# Patient Record
Sex: Female | Born: 2000 | Race: White | Hispanic: No | Marital: Single | Smoking: Current every day smoker
Health system: Southern US, Community
[De-identification: ages and names within clinical notes are randomized; demographics above are authoritative.]

## PROBLEM LIST (undated history)

## (undated) DIAGNOSIS — R519 Headache, unspecified: Secondary | ICD-10-CM

## (undated) DIAGNOSIS — Z789 Other specified health status: Secondary | ICD-10-CM

## (undated) HISTORY — PX: APPENDECTOMY: SHX54

---
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
## 2021-01-01 LAB — CBC/D/PLT+RPR+RH+ABO+RUBIGG...
Antibody Screen: NEGATIVE
Basophils Absolute: 0 10*3/uL (ref 0.0–0.2)
Basos: 0 %
EOS (ABSOLUTE): 0 10*3/uL (ref 0.0–0.4)
Eos: 0 %
HCV Ab: <0.1 s/co ratio (ref 0.0–0.9)
HIV Screen 4th Generation wRfx: NONREACTIVE
Hematocrit: 33.7 % — ABNORMAL LOW (ref 34.0–46.6)
Hemoglobin: 11 g/dL — ABNORMAL LOW (ref 11.1–15.9)
Hepatitis B Surface Ag: NEGATIVE
Immature Grans (Abs): 0.1 10*3/uL (ref 0.0–0.1)
Immature Granulocytes: 1 %
Lymphocytes Absolute: 2.5 10*3/uL (ref 0.7–3.1)
Lymphs: 24 %
MCH: 28.3 pg (ref 26.6–33.0)
MCHC: 32.6 g/dL (ref 31.5–35.7)
MCV: 87 fL (ref 79–97)
Monocytes Absolute: 0.6 10*3/uL (ref 0.1–0.9)
Monocytes: 6 %
Neutrophils Absolute: 7.1 10*3/uL — ABNORMAL HIGH (ref 1.4–7.0)
Neutrophils: 69 %
Platelets: 250 10*3/uL (ref 150–450)
RBC: 3.89 x10E6/uL (ref 3.77–5.28)
RDW: 14.2 % (ref 11.7–15.4)
RPR Ser Ql: NONREACTIVE
Rh Factor: POSITIVE
Rubella Antibodies, IGG: 8.33 index (ref >0.99)
WBC: 10.3 10*3/uL (ref 3.4–10.8)

## 2021-01-01 LAB — URINE CULTURE

## 2021-01-01 LAB — HGB FRACTIONATION CASCADE
Hgb A2: 2.7 % (ref 1.8–3.2)
Hgb A: 97.3 % (ref 96.4–98.8)
Hgb F: 0 % (ref 0.0–2.0)
Hgb S: 0 %

## 2021-01-01 LAB — HCV INTERPRETATION

## 2021-01-28 ENCOUNTER — Telehealth: Payer: Self-pay

## 2021-01-28 ENCOUNTER — Ambulatory Visit (INDEPENDENT_AMBULATORY_CARE_PROVIDER_SITE_OTHER): Payer: 59 | Admitting: Obstetrics & Gynecology

## 2021-01-28 ENCOUNTER — Encounter: Payer: Medicaid Other | Admitting: Women's Health

## 2021-01-28 ENCOUNTER — Encounter: Payer: Self-pay | Admitting: Obstetrics & Gynecology

## 2021-01-28 ENCOUNTER — Other Ambulatory Visit: Payer: Self-pay

## 2021-01-28 VITALS — BP 113/72 | HR 96

## 2021-01-28 DIAGNOSIS — R109 Unspecified abdominal pain: Secondary | ICD-10-CM

## 2021-01-28 DIAGNOSIS — R319 Hematuria, unspecified: Secondary | ICD-10-CM

## 2021-01-28 DIAGNOSIS — O09899 Supervision of other high risk pregnancies, unspecified trimester: Secondary | ICD-10-CM

## 2021-01-28 DIAGNOSIS — Z3480 Encounter for supervision of other normal pregnancy, unspecified trimester: Secondary | ICD-10-CM

## 2021-01-28 LAB — POCT URINALYSIS DIPSTICK OB
Glucose, UA: NEGATIVE
Ketones, UA: NEGATIVE
Nitrite, UA: NEGATIVE

## 2021-01-28 MED ORDER — TERCONAZOLE 0.4 % VA CREA: 1.0000 | TOPICAL_CREAM | Freq: Every day | VAGINAL | 0 refills | Status: DC

## 2021-01-28 NOTE — Telephone Encounter (Signed)
Pt called stating that 2 days ago she had some light bleeding, red in color, with strong cramping. It lasted for a couple of hours and stopped. No bleeding yesterday. Pt stated that the bleeding started again this morning at approx 7:30 am with strong cramping. Pt states that she sees the blood when she wipes. Pt denies any recent sex or straining with activity. Pt states that baby is moving less than she normally has been. Discussed with Joellyn Haff and said she needed to be seen. Pt appt moved to Dr Despina Hidden for an earlier time. Pt confirmed understanding.

## 2021-01-28 NOTE — Progress Notes (Signed)
LOW-RISK PREGNANCY VISIT Patient name: DNIYA NEUHAUS MRN [id number]  Date of birth: [birth year] Chief Complaint:   Routine Prenatal Visit (Light spotting with mild cramping, pressure with urination, chills occasionally)  History of Present Illness:   RASHA IBE is a 20 y.o. G65P1001 female at [redacted]w[redacted]d with an Estimated Date of Delivery: 05/24/21 being seen today for ongoing management of a low-risk pregnancy. Noticed some spotting when wiping, no frank bleeding and less movement  Sonogram reveals very active fetus, posterior placenta well awat from cervix and no evidence of MSA  SSE negative no blood has a yeast infection  Today she reports as above. Contractions: Not present. Vag. Bleeding: Scant.  Movement: (!) Decreased. denies leaking of fluid.  Depression screen [facility] 2/9 12/30/2020 08/23/2019 08/01/2019  Decreased Interest 0 0 0  Down, Depressed, Hopeless 0 0 0  PHQ - 2 Score 0 0 0  Altered sleeping 0 1 -  Tired, decreased energy 1 0 -  Change in appetite 0 1 -  Feeling bad or failure about yourself  0 0 -  Trouble concentrating 0 0 -  Moving slowly or fidgety/restless 0 0 -  Suicidal thoughts 0 0 -  PHQ-9 Score 1 2 -     GAD 7 : Generalized Anxiety Score 12/30/2020  Nervous, Anxious, on Edge 0  Control/stop worrying 0  Worry too much - different things 0  Trouble relaxing 0  Restless 0  Easily annoyed or irritable 1  Afraid - awful might happen 0  Total GAD 7 Score 1      Review of Systems:   Pertinent items are noted in HPI Denies abnormal vaginal discharge w/ itching/odor/irritation, headaches, visual changes, shortness of breath, chest pain, abdominal pain, severe nausea/vomiting, or problems with urination or bowel movements unless otherwise stated above. Pertinent History Reviewed:  Reviewed past medical,surgical, social, obstetrical and family history.  Reviewed problem list, medications and allergies. Physical Assessment:   Vitals:   01/28/21 1203  BP:  113/72  Pulse: 96  There is no height or weight on file to calculate BMI.        Physical Examination:   General appearance: Well appearing, and in no distress  Mental status: Alert, oriented to person, place, and time  Skin: Warm & dry  Cardiovascular: Normal heart rate noted  Respiratory: Normal respiratory effort, no distress  Abdomen: Soft, gravid, nontender  Pelvic: Cervical exam deferred         Extremities: Edema: None  Fetal Status:     Movement: (!) Decreased    Chaperone: N/A   Results for orders placed or performed in visit on 01/28/21 (from the past 24 hour(s))  POC Urinalysis Dipstick OB   Collection Time: 01/28/21 12:15 PM  Result Value Ref Range   Color, UA     Clarity, UA     Glucose, UA Negative Negative   Bilirubin, UA     Ketones, UA neg    Spec Grav, UA     Blood, UA large    pH, UA     POC,PROTEIN,UA Small (1+) Negative, Trace, Small (1+), Moderate (2+), Large (3+), 4+   Urobilinogen, UA     Nitrite, UA neg    Leukocytes, UA Moderate (2+) (A) Negative   Appearance     Odor      Assessment & Plan:  1) Low-risk pregnancy G2P1001 at [redacted]w[redacted]d with an Estimated Date of Delivery: 05/24/21   2) +yeast vaginitis, terazol   Meds:  Meds  ordered this encounter  Medications   terconazole (TERAZOL 7) 0.4 % vaginal cream    Sig: Place 1 applicator vaginally at bedtime.    Dispense:  45 g    Refill:  0   Labs/procedures today: bedside POC sonogram  Plan:  Continue routine obstetrical care  Next visit: prefers in person    Reviewed:  labor symptoms and general obstetric precautions including but not limited to vaginal bleeding, contractions, leaking of fluid and fetal movement were reviewed in detail with the patient.  All questions were answered.  have home bp cuff. Office bp cuff given: Marland Kitchen Check bp , let us know if consistently .  Follow-up: No follow-ups on file.  No future appointments.  Orders Placed This Encounter  Procedures   Urine Culture   POC  Urinalysis Dipstick OB   Amaryllis Dyke Lequisha Cammack  01/28/2021 12:46 PM

## 2021-01-30 LAB — URINE CULTURE

## 2021-02-26 ENCOUNTER — Other Ambulatory Visit: Payer: 59

## 2021-02-26 ENCOUNTER — Encounter: Payer: 59 | Admitting: Advanced Practice Midwife

## 2021-02-26 DIAGNOSIS — Z3A27 27 weeks gestation of pregnancy: Secondary | ICD-10-CM

## 2021-03-21 IMAGING — US US OB < 14 WEEKS - US OB TV
1 series · 14 of 28 positions shown · non-contrast
Comparison: None.

CLINICAL DATA: Pelvic pain for several hours, positive pregnancy
test

EXAM:
OBSTETRIC <14 WK US AND TRANSVAGINAL OB US
TECHNIQUE: Both transabdominal and transvaginal ultrasound examinations were
performed for complete evaluation of the gestation as well as the
maternal uterus, adnexal regions, and pelvic cul-de-sac.
Transvaginal technique was performed to assess early pregnancy.

[Series 1: us ob < 14 weeks - us ob tv · 0.17mm/px · 14 of 77 slices shown]
[im 3/77]
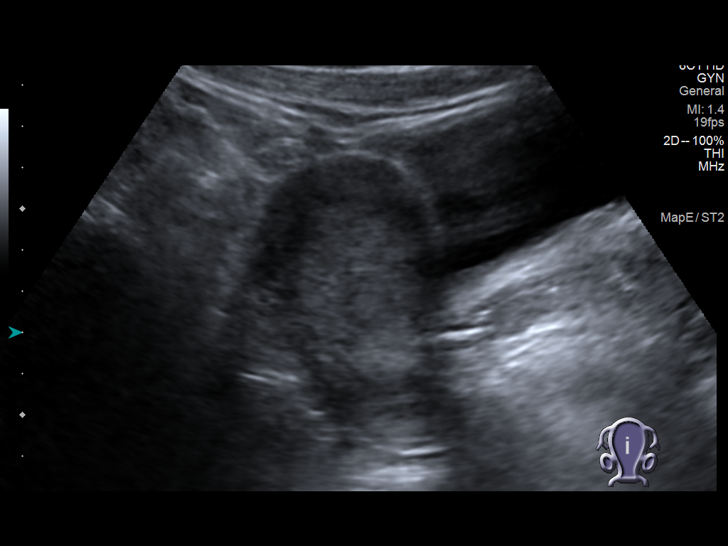
[im 9/77]
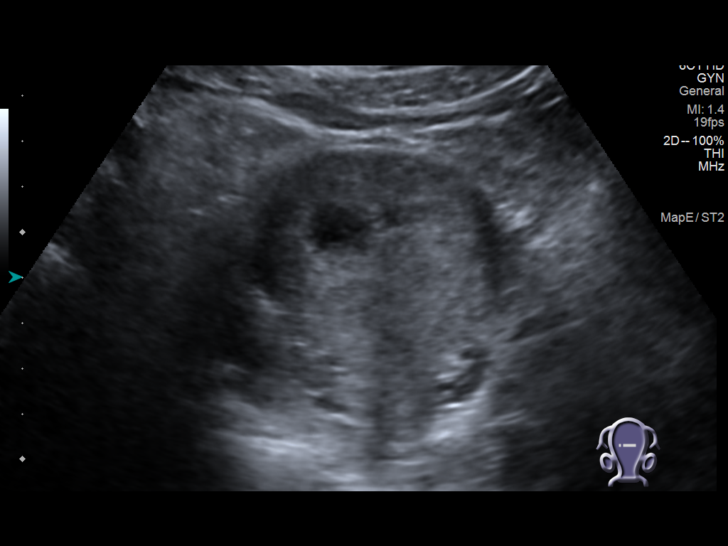
[im 15/77]
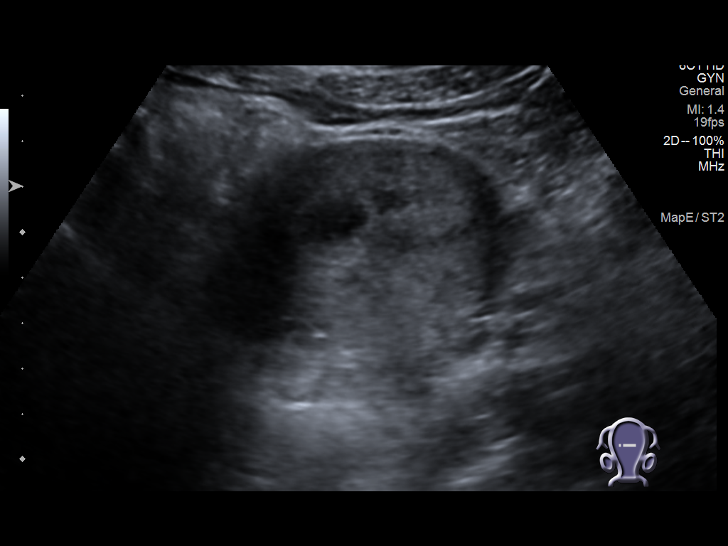
[im 20/77]
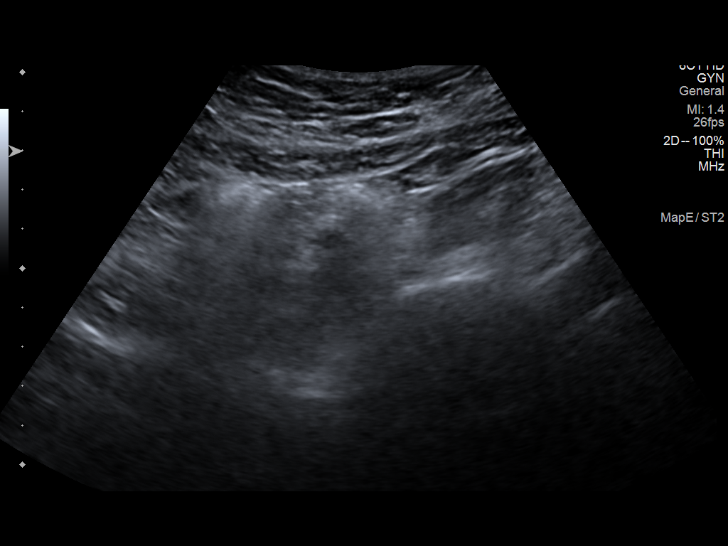
[im 26/77]
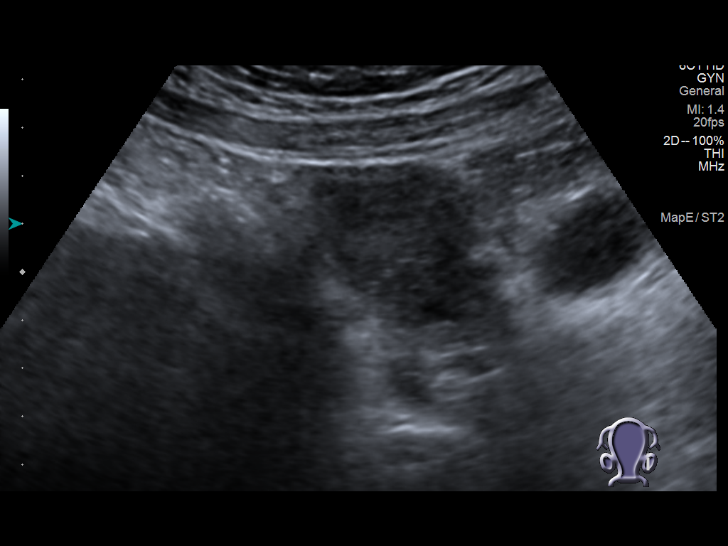
[im 31/77]
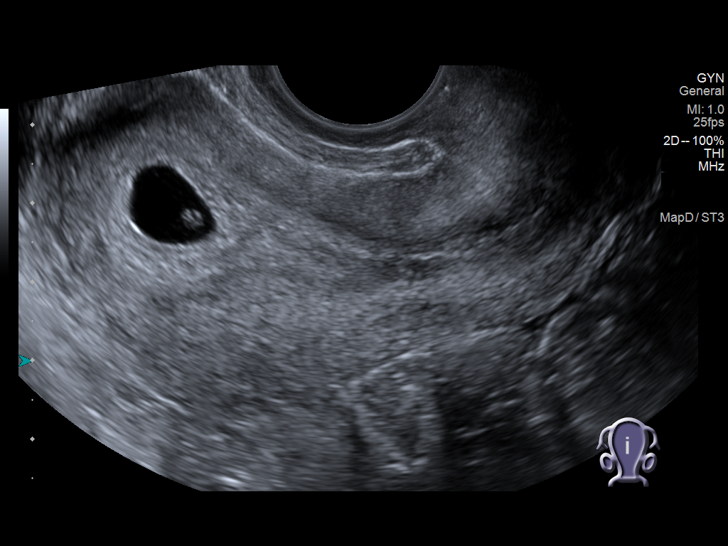
[im 37/77]
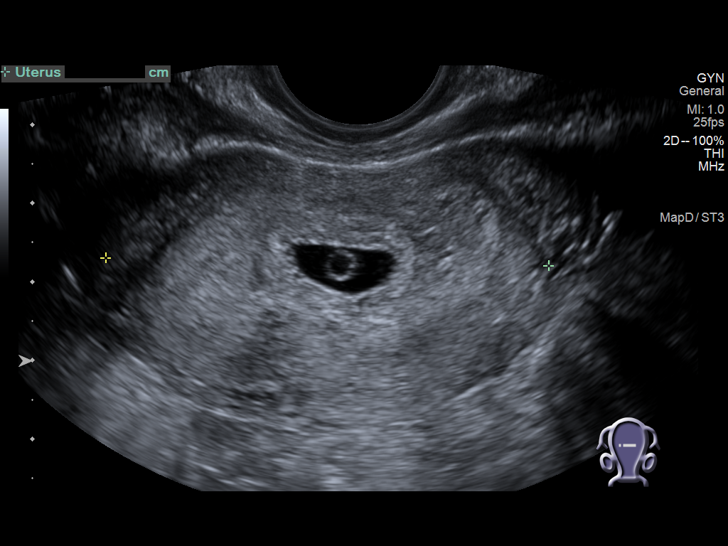
[im 43/77]
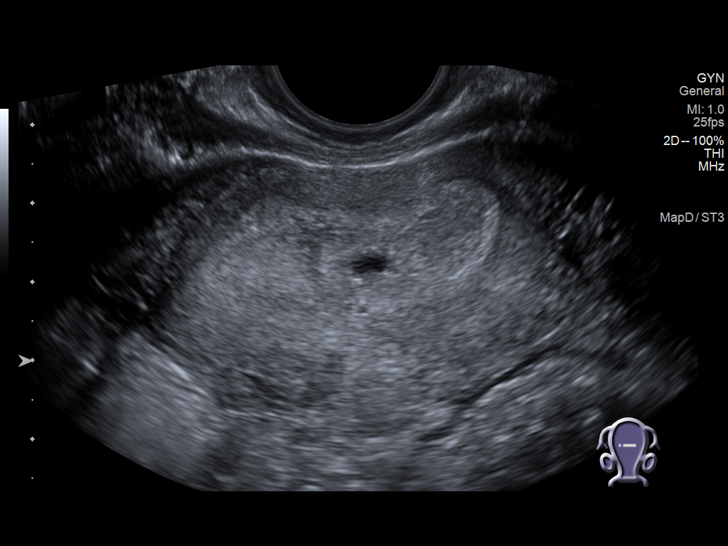
[im 48/77]
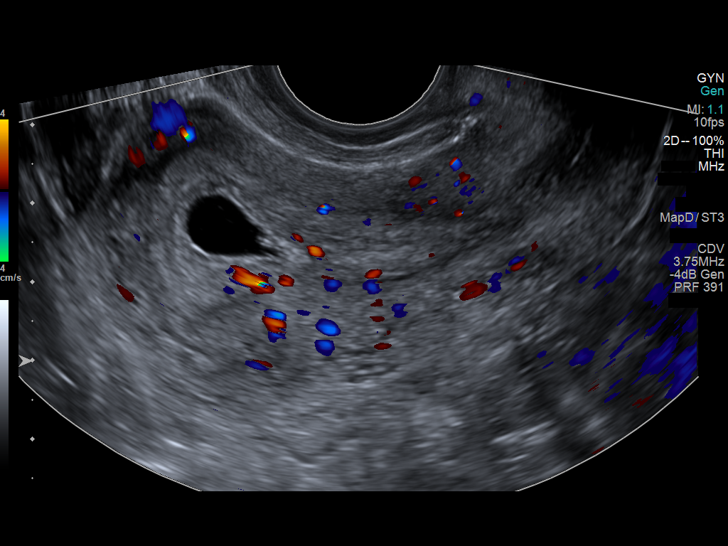
[im 54/77]
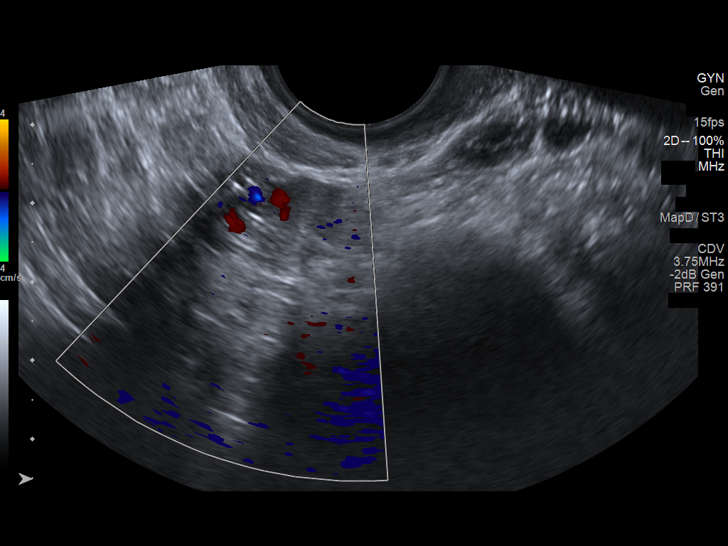
[im 60/77]
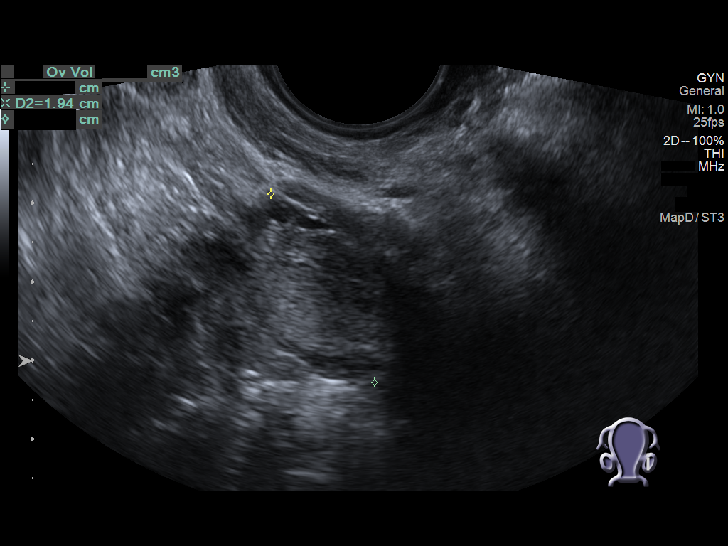
[im 65/77]
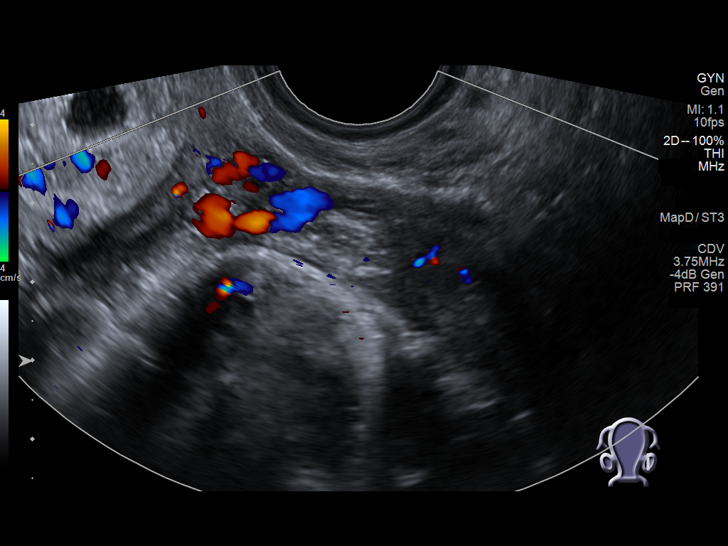
[im 71/77]
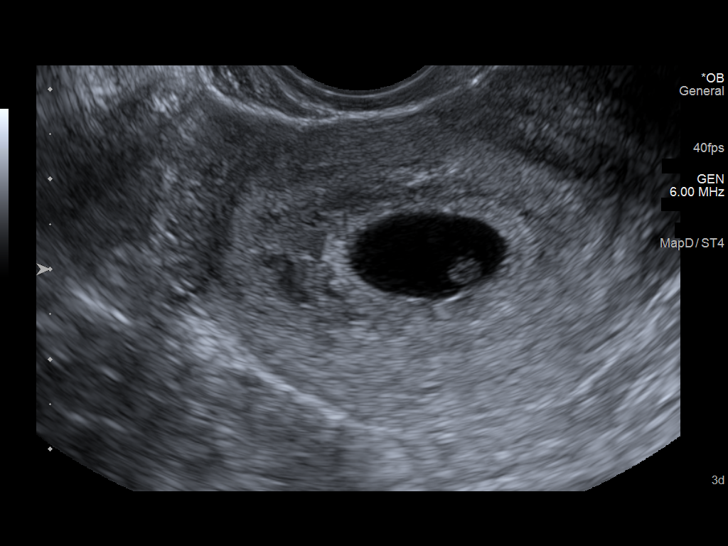
[im 77/77]
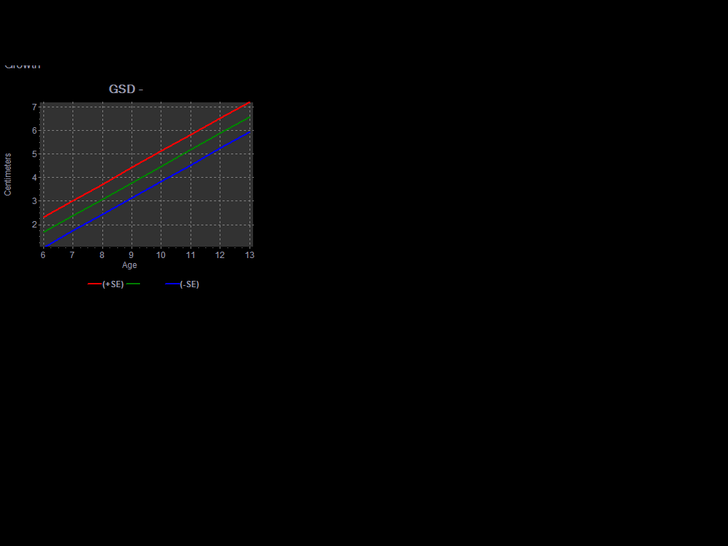

[14 of 28 positions shown; findings below may reference images not displayed]

FINDINGS: Intrauterine gestational sac: Present

Yolk sac:  Present

Embryo:  Absent

MSD: 18.6 mm   6 w   6 d

Subchorionic hemorrhage:  None visualized.

Maternal uterus/adnexae: Within normal limits.
IMPRESSION: Probable early intrauterine gestational sac and yolk sac but no
fetal pole, or cardiac activity yet visualized. Recommend follow-up
quantitative B-HCG levels and follow-up US in 14 days to assess
viability. This recommendation follows SRU consensus guidelines:
Diagnostic Criteria for Nonviable Pregnancy Early in the First
Trimester. N Engl J Med 2836; [DATE].

## 2021-04-09 ENCOUNTER — Encounter: Payer: 59 | Admitting: Women's Health

## 2021-04-09 ENCOUNTER — Other Ambulatory Visit: Payer: 59

## 2021-05-05 ENCOUNTER — Telehealth: Payer: Self-pay | Admitting: *Deleted

## 2021-05-05 DIAGNOSIS — O4290 Premature rupture of membranes, unspecified as to length of time between rupture and onset of labor, unspecified weeks of gestation: Secondary | ICD-10-CM | POA: Diagnosis not present

## 2021-05-05 DIAGNOSIS — Z3A37 37 weeks gestation of pregnancy: Secondary | ICD-10-CM | POA: Diagnosis not present

## 2021-05-05 NOTE — Telephone Encounter (Signed)
Patient's grandmother called stating [name] had sent her a message via facebook messenger that she was leaking fluid.  States it is a continuous leak and underwear are wet.  Advised she go to [facility] for SROM eval.  Verbalized understanding and agreeable to take her.

## 2021-05-13 ENCOUNTER — Ambulatory Visit (INDEPENDENT_AMBULATORY_CARE_PROVIDER_SITE_OTHER): Payer: 59 | Admitting: Obstetrics & Gynecology

## 2021-05-13 ENCOUNTER — Other Ambulatory Visit: Payer: Self-pay

## 2021-05-13 ENCOUNTER — Other Ambulatory Visit: Payer: 59

## 2021-05-13 ENCOUNTER — Encounter: Payer: Self-pay | Admitting: Obstetrics & Gynecology

## 2021-05-13 ENCOUNTER — Other Ambulatory Visit (HOSPITAL_COMMUNITY)
Admission: RE | Admit: 2021-05-13 | Discharge: 2021-05-13 | Disposition: A | Discharge status: Home / Self Care | Payer: Medicaid Other | Source: Ambulatory Visit | Attending: Obstetrics & Gynecology | Admitting: Obstetrics & Gynecology

## 2021-05-13 VITALS — BP 116/72 | HR 98 | Wt 191.5 lb

## 2021-05-13 DIAGNOSIS — Z3A38 38 weeks gestation of pregnancy: Secondary | ICD-10-CM | POA: Diagnosis not present

## 2021-05-13 DIAGNOSIS — Z3480 Encounter for supervision of other normal pregnancy, unspecified trimester: Secondary | ICD-10-CM | POA: Diagnosis not present

## 2021-05-13 NOTE — Progress Notes (Signed)
° °  LOW-RISK PREGNANCY VISIT Patient name: [name] MRN [id number]  Date of birth: 2001-04-19 Chief Complaint:   Routine Prenatal Visit (GC/CHL)  History of Present Illness:   [name] is a 20 y.o. G52P1001 female at [redacted]w[redacted]d with an Estimated Date of Delivery: 05/24/21 being seen today for ongoing management of a low-risk pregnancy.   -Scant PNC- last seen in August -seen at outside facility for rule out rupture- GBS completed- neg  Depression screen [facility] 2/9 05/13/2021 12/30/2020 08/23/2019 08/01/2019  Decreased Interest 0 0 0 0  Down, Depressed, Hopeless 0 0 0 0  PHQ - 2 Score 0 0 0 0  Altered sleeping 1 0 1 -  Tired, decreased energy 1 1 0 -  Change in appetite 0 0 1 -  Feeling bad or failure about yourself  0 0 0 -  Trouble concentrating 1 0 0 -  Moving slowly or fidgety/restless 0 0 0 -  Suicidal thoughts 0 0 0 -  PHQ-9 Score 3 1 2  -    Today she reports no complaints. Contractions: Not present. Vag. Bleeding: None.  Movement: Present. denies leaking of fluid. Review of Systems:   Pertinent items are noted in HPI Denies abnormal vaginal discharge w/ itching/odor/irritation, headaches, visual changes, shortness of breath, chest pain, abdominal pain, severe nausea/vomiting, or problems with urination or bowel movements unless otherwise stated above. Pertinent History Reviewed:  Reviewed past medical,surgical, social, obstetrical and family history.  Reviewed problem list, medications and allergies.  Physical Assessment:   Vitals:   05/13/21 1346  BP: 116/72  Pulse: 98  Weight: 191 lb 8 oz (86.9 kg)  Body mass index is 29.99 kg/m.        Physical Examination:   General appearance: Well appearing, and in no distress  Mental status: Alert, oriented to person, place, and time  Skin: Warm & dry  Respiratory: Normal respiratory effort, no distress  Abdomen: Soft, gravid, nontender  Pelvic: Cervical exam deferred  Vaginal swab obtained       Extremities: Edema:  None  Psych:  mood and affect appropriate  Fetal Status: Fetal Heart Rate (bpm): 125 Fundal Height: 32 cm Movement: Present Presentation: Vertex  Chaperone:  Dr. 05/15/21    Bedside Wynelle Link- vertex, AFI appears visually normal  No results found for this or any previous visit (from the past 24 hour(s)).   Assessment & Plan:  1) Low-risk pregnancy G2P1001 at [redacted]w[redacted]d with an Estimated Date of Delivery: 05/24/21   2) Small for dates- plan for growth scan next available  -encouraged weekly visits    Meds: No orders of the defined types were placed in this encounter.  Labs/procedures today: GC/C, CBC, RPR  Plan:  Continue routine obstetrical care  Next visit: prefers in person    Reviewed: Term labor symptoms and general obstetric precautions including but not limited to vaginal bleeding, contractions, leaking of fluid and fetal movement were reviewed in detail with the patient.  All questions were answered.   Follow-up: Return in about 1 week (around 05/20/2021) for LROB visit.  Orders Placed This Encounter  Procedures   CBC   RPR    05/22/2021, DO Attending Obstetrician & Gynecologist, [facility]

## 2021-05-14 ENCOUNTER — Other Ambulatory Visit: Payer: Self-pay | Admitting: Women's Health

## 2021-05-14 LAB — RPR: RPR Ser Ql: NONREACTIVE

## 2021-05-14 LAB — GLUCOSE TOLERANCE, 2 HOURS W/ 1HR
Glucose, 1 hour: 102 mg/dL (ref 70–179)
Glucose, 2 hour: 78 mg/dL (ref 70–152)
Glucose, Fasting: 81 mg/dL (ref 70–91)

## 2021-05-14 LAB — CBC
Hematocrit: 30.6 % — ABNORMAL LOW (ref 34.0–46.6)
Hemoglobin: 9.6 g/dL — ABNORMAL LOW (ref 11.1–15.9)
MCH: 24.3 pg — ABNORMAL LOW (ref 26.6–33.0)
MCHC: 31.4 g/dL — ABNORMAL LOW (ref 31.5–35.7)
MCV: 78 fL — ABNORMAL LOW (ref 79–97)
Platelets: 173 10*3/uL (ref 150–450)
RBC: 3.95 x10E6/uL (ref 3.77–5.28)
RDW: 14.7 % (ref 11.7–15.4)
WBC: 8.1 10*3/uL (ref 3.4–10.8)

## 2021-05-14 LAB — ANTIBODY SCREEN: Antibody Screen: NEGATIVE

## 2021-05-14 LAB — HIV ANTIBODY (ROUTINE TESTING W REFLEX): HIV Screen 4th Generation wRfx: NONREACTIVE

## 2021-05-14 MED ORDER — FERROUS SULFATE 325 (65 FE) MG PO TABS: 325.0000 mg | ORAL_TABLET | ORAL | 2 refills | Status: DC

## 2021-05-15 LAB — CERVICOVAGINAL ANCILLARY ONLY
Chlamydia: NEGATIVE
Comment: NEGATIVE
Comment: NORMAL
Neisseria Gonorrhea: NEGATIVE

## 2021-05-20 ENCOUNTER — Telehealth: Payer: Self-pay

## 2021-05-20 ENCOUNTER — Ambulatory Visit: Payer: 59 | Admitting: Obstetrics & Gynecology

## 2021-05-20 ENCOUNTER — Other Ambulatory Visit: Payer: Self-pay

## 2021-05-20 NOTE — Telephone Encounter (Signed)
-----   Message from Cheral Marker, [place] sent at 05/19/2021  4:41 PM EST ----- Hasn't read [employer]. Please call and read it to her/have her read it. Thanks

## 2021-05-20 NOTE — Telephone Encounter (Signed)
Called pt to inform her of test results and msgs from Joellyn Haff that she had not read on Mychart. No answer, left vm.

## 2021-05-21 ENCOUNTER — Telehealth: Payer: Self-pay

## 2021-05-21 NOTE — Telephone Encounter (Signed)
Called pt to relay test results and prescription, no answer, left vm

## 2021-05-21 NOTE — Telephone Encounter (Signed)
Called pt to relay test results and prescription, mailbox is not available for vm

## 2021-05-22 ENCOUNTER — Telehealth: Payer: Self-pay

## 2021-05-22 NOTE — Telephone Encounter (Signed)
3rd and final attempt to reach pt regarding her test results, no answer, no vm

## 2021-05-23 DIAGNOSIS — Z1389 Encounter for screening for other disorder: Secondary | ICD-10-CM | POA: Diagnosis not present

## 2021-05-29 ENCOUNTER — Other Ambulatory Visit: Payer: 59 | Admitting: Obstetrics & Gynecology

## 2021-05-30 ENCOUNTER — Telehealth: Payer: Self-pay

## 2021-05-30 NOTE — Telephone Encounter (Signed)
Transition Care Management Unsuccessful Follow-up Telephone Call  Date of discharge and from where:  05/29/2021[name]  Attempts:  1st Attempt  Reason for unsuccessful TCM follow-up call:  Missing or invalid number Someone answered and stated "wrong number"

## 2021-06-02 NOTE — Telephone Encounter (Signed)
Transition Care Management Unsuccessful Follow-up Telephone Call  Date of discharge and from where:  05/29/2021 from [facility]  Attempts:  2nd Attempt  Reason for unsuccessful TCM follow-up call:  Unable to leave message   Updated number (mobile is contact for "friend" listed in patient's demographics).

## 2021-06-03 NOTE — Telephone Encounter (Signed)
Transition Care Management Unsuccessful Follow-up Telephone Call ° °Date of discharge and from where:  05/29/2021 from [facility] ° °Attempts:  3rd Attempt ° °Reason for unsuccessful TCM follow-up call:  Unable to reach patient ° ° ° °

## 2021-06-30 ENCOUNTER — Encounter: Payer: Self-pay | Admitting: *Deleted

## 2021-07-02 ENCOUNTER — Ambulatory Visit: Payer: 59 | Admitting: Advanced Practice Midwife

## 2022-03-11 ENCOUNTER — Ambulatory Visit (INDEPENDENT_AMBULATORY_CARE_PROVIDER_SITE_OTHER): Payer: Medicaid Other | Admitting: *Deleted

## 2022-03-11 VITALS — BP 127/88 | HR 105 | Wt 171.0 lb

## 2022-03-11 DIAGNOSIS — N926 Irregular menstruation, unspecified: Secondary | ICD-10-CM

## 2022-03-11 DIAGNOSIS — Z3201 Encounter for pregnancy test, result positive: Secondary | ICD-10-CM

## 2022-03-11 LAB — POCT URINE PREGNANCY: Preg Test, Ur: POSITIVE — AB

## 2022-03-11 NOTE — Progress Notes (Signed)
   NURSE VISIT- PREGNANCY CONFIRMATION   SUBJECTIVE:  [name] is a 21 y.o. G59P2002 female at [redacted]w[redacted]d by certain LMP of Patient's last menstrual period was 01/18/2022 (exact date). Here for pregnancy confirmation.  Home pregnancy test: positive x 2   She reports no complaints.  She is not taking prenatal vitamins.    OBJECTIVE:  BP 127/88 (BP Location: Left Arm, Patient Position: Sitting, Cuff Size: Normal)   Pulse (!) 105   Wt 171 lb (77.6 kg)   LMP 01/18/2022 (Exact Date)   Breastfeeding No   BMI 26.78 kg/m   Appears well, in no apparent distress  Results for orders placed or performed in visit on 03/11/22 (from the past 24 hour(s))  POCT urine pregnancy   Collection Time: 03/11/22  3:27 PM  Result Value Ref Range   Preg Test, Ur Positive (A) Negative    ASSESSMENT: Positive pregnancy test, [redacted]w[redacted]d by LMP    PLAN: Schedule for dating ultrasound next available  Prenatal vitamins: plans to begin OTC ASAP   Nausea medicines: not currently needed   OB packet given: No, still has packet from most recent pregnancy  Alice Rieger  03/11/2022 3:29 PM

## 2022-03-12 DIAGNOSIS — N3000 Acute cystitis without hematuria: Secondary | ICD-10-CM | POA: Diagnosis not present

## 2022-03-12 DIAGNOSIS — R1084 Generalized abdominal pain: Secondary | ICD-10-CM | POA: Diagnosis not present

## 2022-03-12 DIAGNOSIS — R000 Tachycardia, unspecified: Secondary | ICD-10-CM | POA: Diagnosis not present

## 2022-03-12 DIAGNOSIS — R1111 Vomiting without nausea: Secondary | ICD-10-CM | POA: Diagnosis not present

## 2022-03-12 DIAGNOSIS — O219 Vomiting of pregnancy, unspecified: Secondary | ICD-10-CM | POA: Diagnosis not present

## 2022-03-12 DIAGNOSIS — O26891 Other specified pregnancy related conditions, first trimester: Secondary | ICD-10-CM | POA: Diagnosis not present

## 2022-03-12 DIAGNOSIS — R100 Acute abdomen: Secondary | ICD-10-CM | POA: Diagnosis not present

## 2022-03-26 ENCOUNTER — Ambulatory Visit (INDEPENDENT_AMBULATORY_CARE_PROVIDER_SITE_OTHER): Payer: Medicaid Other

## 2022-03-26 ENCOUNTER — Encounter: Payer: Medicaid Other | Admitting: *Deleted

## 2022-03-26 ENCOUNTER — Other Ambulatory Visit: Payer: Self-pay | Admitting: Obstetrics & Gynecology

## 2022-03-26 DIAGNOSIS — O3680X0 Pregnancy with inconclusive fetal viability, not applicable or unspecified: Secondary | ICD-10-CM | POA: Diagnosis not present

## 2022-03-26 NOTE — Progress Notes (Signed)
[name] 7+6 wks,single IUP with yolk sac,CRL 15.18 mm,normal ovaries

## 2022-04-03 ENCOUNTER — Other Ambulatory Visit: Payer: Medicaid Other

## 2022-04-03 ENCOUNTER — Ambulatory Visit: Payer: Medicaid Other | Admitting: *Deleted

## 2022-04-28 ENCOUNTER — Other Ambulatory Visit: Payer: Self-pay | Admitting: Obstetrics & Gynecology

## 2022-04-28 DIAGNOSIS — Z3682 Encounter for antenatal screening for nuchal translucency: Secondary | ICD-10-CM

## 2022-04-28 DIAGNOSIS — O3680X0 Pregnancy with inconclusive fetal viability, not applicable or unspecified: Secondary | ICD-10-CM

## 2022-04-29 ENCOUNTER — Encounter: Payer: Self-pay | Admitting: Advanced Practice Midwife

## 2022-04-29 ENCOUNTER — Ambulatory Visit (INDEPENDENT_AMBULATORY_CARE_PROVIDER_SITE_OTHER): Payer: Medicaid Other | Admitting: Advanced Practice Midwife

## 2022-04-29 ENCOUNTER — Ambulatory Visit (INDEPENDENT_AMBULATORY_CARE_PROVIDER_SITE_OTHER): Payer: Medicaid Other

## 2022-04-29 ENCOUNTER — Encounter: Payer: Medicaid Other | Admitting: *Deleted

## 2022-04-29 VITALS — BP 120/72 | HR 96 | Wt 176.0 lb

## 2022-04-29 DIAGNOSIS — Z3480 Encounter for supervision of other normal pregnancy, unspecified trimester: Secondary | ICD-10-CM | POA: Diagnosis not present

## 2022-04-29 DIAGNOSIS — Z3682 Encounter for antenatal screening for nuchal translucency: Secondary | ICD-10-CM

## 2022-04-29 DIAGNOSIS — Z3490 Encounter for supervision of normal pregnancy, unspecified, unspecified trimester: Secondary | ICD-10-CM | POA: Insufficient documentation

## 2022-04-29 DIAGNOSIS — Z8659 Personal history of other mental and behavioral disorders: Secondary | ICD-10-CM | POA: Diagnosis not present

## 2022-04-29 DIAGNOSIS — Z3A12 12 weeks gestation of pregnancy: Secondary | ICD-10-CM

## 2022-04-29 DIAGNOSIS — Z8759 Personal history of other complications of pregnancy, childbirth and the puerperium: Secondary | ICD-10-CM

## 2022-04-29 DIAGNOSIS — Z363 Encounter for antenatal screening for malformations: Secondary | ICD-10-CM

## 2022-04-29 MED ORDER — BLOOD PRESSURE MONITOR MISC: 0 refills | Status: AC

## 2022-04-29 NOTE — Patient Instructions (Signed)
[name], thank you for choosing our office today! We appreciate the opportunity to meet your healthcare needs. You may receive a short survey by mail, e-mail, or through [employer]. If you are happy with your care we would appreciate if you could take just a few minutes to complete the survey questions. We read all of your comments and take your feedback very seriously. Thank you again for choosing our office.  Center for [employer] [facility] Team at [facility]  [facility] at [facility] ([address] [place], [place] [address]) Entrance C, located off of [address] Free 24/7 valet parking   Nausea & Vomiting Have saltine crackers or pretzels by your bed and eat a few bites before you raise your head out of bed in the morning Eat small frequent meals throughout the day instead of large meals Drink plenty of fluids throughout the day to stay hydrated, just don't drink a lot of fluids with your meals.  This can make your stomach fill up faster making you feel sick Do not brush your teeth right after you eat Products with real ginger are good for nausea, like ginger ale and ginger hard candy Make sure it says made with real ginger! Sucking on sour candy like lemon heads is also good for nausea If your prenatal vitamins make you nauseated, take them at night so you will sleep through the nausea Sea Bands If you feel like you need medicine for the nausea & vomiting please let us know If you are unable to keep any fluids or food down please let us know   Constipation Drink plenty of fluid, preferably water, throughout the day Eat foods high in fiber such as fruits, vegetables, and grains Exercise, such as walking, is a good way to keep your bowels regular Drink warm fluids, especially warm prune juice, or decaf coffee Eat a 1/2 cup of real oatmeal (not instant), 1/2 cup applesauce, and 1/2-1 cup warm prune juice every day If needed, you may take Colace (docusate sodium) stool softener  once or twice a day to help keep the stool soft.  If you still are having problems with constipation, you may take Miralax once daily as needed to help keep your bowels regular.   Home Blood Pressure Monitoring for Patients   Your provider has recommended that you check your blood pressure (BP) at least once a week at home. If you do not have a blood pressure cuff at home, one will be provided for you. Contact your provider if you have not received your monitor within 1 week.   Helpful Tips for Accurate Home Blood Pressure Checks  Don't smoke, exercise, or drink caffeine 30 minutes before checking your BP Use the restroom before checking your BP (a full bladder can raise your pressure) Relax in a comfortable upright chair Feet on the ground Left arm resting comfortably on a flat surface at the level of your heart Legs uncrossed Back supported Sit quietly and don't talk Place the cuff on your bare arm Adjust snuggly, so that only two fingertips can fit between your skin and the top of the cuff Check 2 readings separated by at least one minute Keep a log of your BP readings For a visual, please reference this diagram: http://ccnc.care/bpdiagram  Provider Name: [facility]     Phone: [phone]  Zone 1: ALL CLEAR  Continue to monitor your symptoms:  BP reading is less than 140 (top number) or less than 90 (bottom  number)  No right upper stomach pain No headaches or seeing spots No feeling nauseated or throwing up No swelling in face and hands  Zone 2: CAUTION Call your doctor's office for any of the following:  BP reading is greater than 140 (top number) or greater than 90 (bottom number)  Stomach pain under your ribs in the middle or right side Headaches or seeing spots Feeling nauseated or throwing up Swelling in face and hands  Zone 3: EMERGENCY  Seek immediate medical care if you have any of the following:  BP reading is greater than160 (top number) or greater than  110 (bottom number) Severe headaches not improving with Tylenol Serious difficulty catching your breath Any worsening symptoms from Zone 2    First Trimester of Pregnancy The first trimester of pregnancy is from week 1 until the end of week 12 (months 1 through 3). A week after a sperm fertilizes an egg, the egg will implant on the wall of the uterus. This embryo will begin to develop into a baby. Genes from you and your partner are forming the baby. The female genes determine whether the baby is a boy or a girl. At 6-8 weeks, the eyes and face are formed, and the heartbeat can be seen on ultrasound. At the end of 12 weeks, all the baby's organs are formed.  Now that you are pregnant, you will want to do everything you can to have a healthy baby. Two of the most important things are to get good prenatal care and to follow your health care provider's instructions. Prenatal care is all the medical care you receive before the baby's birth. This care will help prevent, find, and treat any problems during the pregnancy and childbirth. BODY CHANGES Your body goes through many changes during pregnancy. The changes vary from woman to woman.  You may gain or lose a couple of pounds at first. You may feel sick to your stomach (nauseous) and throw up (vomit). If the vomiting is uncontrollable, call your health care provider. You may tire easily. You may develop headaches that can be relieved by medicines approved by your health care provider. You may urinate more often. Painful urination may mean you have a bladder infection. You may develop heartburn as a result of your pregnancy. You may develop constipation because certain hormones are causing the muscles that push waste through your intestines to slow down. You may develop hemorrhoids or swollen, bulging veins (varicose veins). Your breasts may begin to grow larger and become tender. Your nipples may stick out more, and the tissue that surrounds them  (areola) may become darker. Your gums may bleed and may be sensitive to brushing and flossing. Dark spots or blotches (chloasma, mask of pregnancy) may develop on your face. This will likely fade after the baby is born. Your menstrual periods will stop. You may have a loss of appetite. You may develop cravings for certain kinds of food. You may have changes in your emotions from day to day, such as being excited to be pregnant or being concerned that something may go wrong with the pregnancy and baby. You may have more vivid and strange dreams. You may have changes in your hair. These can include thickening of your hair, rapid growth, and changes in texture. Some women also have hair loss during or after pregnancy, or hair that feels dry or thin. Your hair will most likely return to normal after your baby is born. WHAT TO EXPECT AT YOUR PRENATAL  VISITS During a routine prenatal visit: You will be weighed to make sure you and the baby are growing normally. Your blood pressure will be taken. Your abdomen will be measured to track your baby's growth. The fetal heartbeat will be listened to starting around week 10 or 12 of your pregnancy. Test results from any previous visits will be discussed. Your health care provider may ask you: How you are feeling. If you are feeling the baby move. If you have had any abnormal symptoms, such as leaking fluid, bleeding, severe headaches, or abdominal cramping. If you have any questions. Other tests that may be performed during your first trimester include: Blood tests to find your blood type and to check for the presence of any previous infections. They will also be used to check for low iron levels (anemia) and Rh antibodies. Later in the pregnancy, blood tests for diabetes will be done along with other tests if problems develop. Urine tests to check for infections, diabetes, or protein in the urine. An ultrasound to confirm the proper growth and development  of the baby. An amniocentesis to check for possible genetic problems. Fetal screens for spina bifida and Down syndrome. You may need other tests to make sure you and the baby are doing well. HOME CARE INSTRUCTIONS  Medicines Follow your health care provider's instructions regarding medicine use. Specific medicines may be either safe or unsafe to take during pregnancy. Take your prenatal vitamins as directed. If you develop constipation, try taking a stool softener if your health care provider approves. Diet Eat regular, well-balanced meals. Choose a variety of foods, such as meat or vegetable-based protein, fish, milk and low-fat dairy products, vegetables, fruits, and whole grain breads and cereals. Your health care provider will help you determine the amount of weight gain that is right for you. Avoid raw meat and uncooked cheese. These carry germs that can cause birth defects in the baby. Eating four or five small meals rather than three large meals a day may help relieve nausea and vomiting. If you start to feel nauseous, eating a few soda crackers can be helpful. Drinking liquids between meals instead of during meals also seems to help nausea and vomiting. If you develop constipation, eat more high-fiber foods, such as fresh vegetables or fruit and whole grains. Drink enough fluids to keep your urine clear or pale yellow. Activity and Exercise Exercise only as directed by your health care provider. Exercising will help you: Control your weight. Stay in shape. Be prepared for labor and delivery. Experiencing pain or cramping in the lower abdomen or low back is a good sign that you should stop exercising. Check with your health care provider before continuing normal exercises. Try to avoid standing for long periods of time. Move your legs often if you must stand in one place for a long time. Avoid heavy lifting. Wear low-heeled shoes, and practice good posture. You may continue to have sex  unless your health care provider directs you otherwise. Relief of Pain or Discomfort Wear a good support bra for breast tenderness.   Take warm sitz baths to soothe any pain or discomfort caused by hemorrhoids. Use hemorrhoid cream if your health care provider approves.   Rest with your legs elevated if you have leg cramps or low back pain. If you develop varicose veins in your legs, wear support hose. Elevate your feet for 15 minutes, 3-4 times a day. Limit salt in your diet. Prenatal Care Schedule your prenatal visits by the  twelfth week of pregnancy. They are usually scheduled monthly at first, then more often in the last 2 months before delivery. Write down your questions. Take them to your prenatal visits. Keep all your prenatal visits as directed by your health care provider. Safety Wear your seat belt at all times when driving. Make a list of emergency phone numbers, including numbers for family, friends, the hospital, and police and fire departments. General Tips Ask your health care provider for a referral to a local prenatal education class. Begin classes no later than at the beginning of month 6 of your pregnancy. Ask for help if you have counseling or nutritional needs during pregnancy. Your health care provider can offer advice or refer you to specialists for help with various needs. Do not use hot tubs, steam rooms, or saunas. Do not douche or use tampons or scented sanitary pads. Do not cross your legs for long periods of time. Avoid cat litter boxes and soil used by cats. These carry germs that can cause birth defects in the baby and possibly loss of the fetus by miscarriage or stillbirth. Avoid all smoking, herbs, alcohol, and medicines not prescribed by your health care provider. Chemicals in these affect the formation and growth of the baby. Schedule a dentist appointment. At home, brush your teeth with a soft toothbrush and be gentle when you floss. SEEK MEDICAL CARE IF:   You have dizziness. You have mild pelvic cramps, pelvic pressure, or nagging pain in the abdominal area. You have persistent nausea, vomiting, or diarrhea. You have a bad smelling vaginal discharge. You have pain with urination. You notice increased swelling in your face, hands, legs, or ankles. SEEK IMMEDIATE MEDICAL CARE IF:  You have a fever. You are leaking fluid from your vagina. You have spotting or bleeding from your vagina. You have severe abdominal cramping or pain. You have rapid weight gain or loss. You vomit blood or material that looks like coffee grounds. You are exposed to [name] measles and have never had them. You are exposed to fifth disease or chickenpox. You develop a severe headache. You have shortness of breath. You have any kind of trauma, such as from a fall or a car accident. Document Released: 05/12/2001 Document Revised: 10/02/2013 Document Reviewed: 03/28/2013 [facility] Patient Information 2015 [place], [place]. This information is not intended to replace advice given to you by your health care provider. Make sure you discuss any questions you have with your health care provider.

## 2022-04-29 NOTE — Progress Notes (Signed)
[name] 12+5 wks,measurements c/w dates,CRL 68.72 mm,NB present,NT 1.9 mm,normal ovaries,FHR 162 bpm

## 2022-04-29 NOTE — Progress Notes (Signed)
INITIAL OBSTETRICAL VISIT Patient name: [name] MRN [id number]  Date of birth: 12/14/2000 Chief Complaint:   Initial Prenatal Visit  History of Present Illness:   LANDREY MAHURIN is a 21 y.o. G31P2002 Caucasian female at [redacted]w[redacted]d by [name] at 7.6 weeks with an Estimated Date of Delivery: 11/06/22 being seen today for her initial obstetrical visit.   Patient's last menstrual period was 01/18/2022 (exact date). Her obstetrical history is significant for  SVB x 2 (2021 & 2022) without complication .   Today she reports no complaints.  Last pap <21yo. Results were: N/A     04/29/2022    2:08 PM 05/13/2021    1:49 PM 12/30/2020    4:02 PM 08/23/2019    3:28 PM 08/01/2019    9:13 AM  Depression screen PHQ 2/9  Decreased Interest 1 0 0 0 0  Down, Depressed, Hopeless 0 0 0 0 0  PHQ - 2 Score 1 0 0 0 0  Altered sleeping 2 1 0 1   Tired, decreased energy 2 1 1  0   Change in appetite 1 0 0 1   Feeling bad or failure about yourself  0 0 0 0   Trouble concentrating 1 1 0 0   Moving slowly or fidgety/restless 0 0 0 0   Suicidal thoughts 0 0 0 0   PHQ-9 Score 7 3 1 2          04/29/2022    2:08 PM 05/13/2021    1:49 PM 12/30/2020    4:03 PM  GAD 7 : Generalized Anxiety Score  Nervous, Anxious, on Edge 1 1 0  Control/stop worrying 1 1 0  Worry too much - different things 1 1 0  Trouble relaxing 1 1 0  Restless 0 1 0  Easily annoyed or irritable 2 0 1  Afraid - awful might happen 0 1 0  Total GAD 7 Score 6 6 1      Review of Systems:   Pertinent items are noted in HPI Denies cramping/contractions, leakage of fluid, vaginal bleeding, abnormal vaginal discharge w/ itching/odor/irritation, headaches, visual changes, shortness of breath, chest pain, abdominal pain, severe nausea/vomiting, or problems with urination or bowel movements unless otherwise stated above.  Pertinent History Reviewed:  Reviewed past medical,surgical, social, obstetrical and family history.  Reviewed problem list,  medications and allergies. OB History  Gravida Para Term Preterm AB Living  3 2 2     2   SAB IAB Ectopic Multiple Live Births          2    # Outcome Date GA Lbr Len/2nd Weight Sex Delivery Anes PTL Lv  3 Current           2 Term 05/28/21 [redacted]w[redacted]d  8 lb 12 oz (3.969 kg) F Vag-Spont EPI N LIV  1 Term 03/05/20 [redacted]w[redacted]d  7 lb 7.9 oz (3.399 kg) M Vag-Spont EPI N LIV   Physical Assessment:   Vitals:   04/29/22 1409  BP: 120/72  Pulse: 96  Weight: 176 lb (79.8 kg)  Body mass index is 27.57 kg/m.       Physical Examination:  General appearance - well appearing, and in no distress  Mental status - alert, oriented to person, place, and time  Psych:  She has a normal mood and affect  Skin - warm and dry, normal color, no suspicious lesions noted  Chest - effort normal, all lung fields clear to auscultation bilaterally  Heart - normal rate and  regular rhythm  Abdomen - soft, nontender  Extremities:  No swelling or varicosities noted  Pelvic - exam not indicated  Thin prep pap is not done    TODAY'S NT [name] 12+5 wks,measurements c/w dates,CRL 68.72 mm,NB present,NT 1.9 mm,normal ovaries,FHR 162 bpm   No results found for this or any previous visit (from the past 24 hour(s)).  Assessment & Plan:  1) Low-Risk Pregnancy G3P2002 at [redacted]w[redacted]d with an Estimated Date of Delivery: 11/06/22   2) Initial OB visit  3) Short interval between pregnancies; SVB 2021 and 2022  4) Hx PPD, used Lexapro  Meds:  Meds ordered this encounter  Medications   Blood Pressure Monitor MISC    Sig: For regular home bp monitoring during pregnancy    Dispense:  1 each    Refill:  0    Z34.81 Please mail to patient    Initial labs obtained Continue prenatal vitamins Reviewed n/v relief measures and warning s/s to report Reviewed recommended weight gain based on pre-gravid BMI Encouraged well-balanced diet Genetic & carrier screening discussed: requests Panorama and NT/IT, declines Horizon  (neg  2022) Ultrasound discussed; fetal survey: requested CCNC completed> form faxed if has or is planning to apply for medicaid The nature of [place] [facility] [employer] with multiple MDs and other Advanced Practice Providers was explained to patient; also emphasized that fellows, residents, and students are part of our team. Does not have home bp cuff. Office bp cuff given: no. Rx sent: yes. Check bp weekly, let us know if consistently >140/90.   No indications for ASA therapy or early HgbA1c (per uptodate)  Follow-up: Return for 4wk LROB and 2nd IT; 7wk LROB and anatomy u/s.   Orders Placed This Encounter  Procedures   Urine Culture   GC/Chlamydia Probe Amp   US OB Comp + 14 Wk   Integrated 1   PANORAMA PRENATAL TEST FULL PANEL   CBC/D/Plt+RPR+Rh+ABO+RubIgG...    Arabella Merles CNM 04/29/2022 2:57 PM

## 2022-05-01 LAB — HCV INTERPRETATION

## 2022-05-01 LAB — CBC/D/PLT+RPR+RH+ABO+RUBIGG...
Antibody Screen: NEGATIVE
Basophils Absolute: 0 10*3/uL (ref 0.0–0.2)
Basos: 1 %
EOS (ABSOLUTE): 0.1 10*3/uL (ref 0.0–0.4)
Eos: 1 %
HCV Ab: NONREACTIVE
HIV Screen 4th Generation wRfx: NONREACTIVE
Hematocrit: 32.5 % — ABNORMAL LOW (ref 34.0–46.6)
Hemoglobin: 10.2 g/dL — ABNORMAL LOW (ref 11.1–15.9)
Hepatitis B Surface Ag: NEGATIVE
Immature Grans (Abs): 0 10*3/uL (ref 0.0–0.1)
Immature Granulocytes: 0 %
Lymphocytes Absolute: 2.7 10*3/uL (ref 0.7–3.1)
Lymphs: 38 %
MCH: 26 pg — ABNORMAL LOW (ref 26.6–33.0)
MCHC: 31.4 g/dL — ABNORMAL LOW (ref 31.5–35.7)
MCV: 83 fL (ref 79–97)
Monocytes Absolute: 0.4 10*3/uL (ref 0.1–0.9)
Monocytes: 5 %
Neutrophils Absolute: 4 10*3/uL (ref 1.4–7.0)
Neutrophils: 55 %
Platelets: 234 10*3/uL (ref 150–450)
RBC: 3.92 x10E6/uL (ref 3.77–5.28)
RDW: 15.2 % (ref 11.7–15.4)
RPR Ser Ql: NONREACTIVE
Rh Factor: POSITIVE
Rubella Antibodies, IGG: 8.24 index (ref >0.99)
WBC: 7.3 10*3/uL (ref 3.4–10.8)

## 2022-05-01 LAB — INTEGRATED 1
Crown Rump Length: 68.7 mm
Gest. Age on Collection Date: 12.9 weeks
Maternal Age at EDD: 20.8 yr
Nuchal Translucency (NT): 1.9 mm
Number of Fetuses: 1
PAPP-A Value: 932.5 ng/mL
Weight: 176 [lb_av]

## 2022-05-01 LAB — URINE CULTURE

## 2022-05-02 LAB — GC/CHLAMYDIA PROBE AMP
Chlamydia trachomatis, NAA: NEGATIVE
Neisseria Gonorrhoeae by PCR: NEGATIVE

## 2022-05-03 ENCOUNTER — Encounter: Payer: Self-pay | Admitting: Advanced Practice Midwife

## 2022-05-03 ENCOUNTER — Other Ambulatory Visit: Payer: Self-pay | Admitting: Advanced Practice Midwife

## 2022-05-03 DIAGNOSIS — O99019 Anemia complicating pregnancy, unspecified trimester: Secondary | ICD-10-CM | POA: Insufficient documentation

## 2022-05-03 DIAGNOSIS — O99011 Anemia complicating pregnancy, first trimester: Secondary | ICD-10-CM

## 2022-05-03 MED ORDER — FERROUS SULFATE 324 (65 FE) MG PO TBEC: 1.0000 | DELAYED_RELEASE_TABLET | ORAL | 3 refills | Status: AC

## 2022-05-06 LAB — PANORAMA PRENATAL TEST FULL PANEL:PANORAMA TEST PLUS 5 ADDITIONAL MICRODELETIONS: FETAL FRACTION: 11.2

## 2022-05-27 ENCOUNTER — Ambulatory Visit (INDEPENDENT_AMBULATORY_CARE_PROVIDER_SITE_OTHER): Payer: Medicaid Other | Admitting: Advanced Practice Midwife

## 2022-05-27 VITALS — BP 113/74 | HR 103 | Wt 176.0 lb

## 2022-05-27 DIAGNOSIS — Z3A16 16 weeks gestation of pregnancy: Secondary | ICD-10-CM

## 2022-05-27 DIAGNOSIS — Z1379 Encounter for other screening for genetic and chromosomal anomalies: Secondary | ICD-10-CM | POA: Diagnosis not present

## 2022-05-27 DIAGNOSIS — Z3480 Encounter for supervision of other normal pregnancy, unspecified trimester: Secondary | ICD-10-CM | POA: Diagnosis not present

## 2022-05-27 NOTE — Progress Notes (Signed)
   LOW-RISK PREGNANCY VISIT Patient name: [name] MRN [id number]  Date of birth: [birth year] Chief Complaint:   Routine Prenatal Visit  History of Present Illness:   [name] is a 21 y.o. G2P2002 female at [redacted]w[redacted]d with an Estimated Date of Delivery: 11/06/22 being seen today for ongoing management of a low-risk pregnancy.  Today she reports no complaints. Contractions: Not present. Vag. Bleeding: None.  Movement: Present. denies leaking of fluid. Review of Systems:   Pertinent items are noted in HPI Denies abnormal vaginal discharge w/ itching/odor/irritation, headaches, visual changes, shortness of breath, chest pain, abdominal pain, severe nausea/vomiting, or problems with urination or bowel movements unless otherwise stated above. Pertinent History Reviewed:  Reviewed past medical,surgical, social, obstetrical and family history.  Reviewed problem list, medications and allergies. Physical Assessment:   Vitals:   05/27/22 1352  BP: 113/74  Pulse: (!) 103  Weight: 176 lb (79.8 kg)  Body mass index is 27.57 kg/m.        Physical Examination:   General appearance: Well appearing, and in no distress  Mental status: Alert, oriented to person, place, and time  Skin: Warm & dry  Cardiovascular: Normal heart rate noted  Respiratory: Normal respiratory effort, no distress  Abdomen: Soft, gravid, nontender  Pelvic: Cervical exam deferred         Extremities: Edema: None  Fetal Status: Fetal Heart Rate (bpm): 156   Movement: Present    No results found for this or any previous visit (from the past 24 hour(s)).  Assessment & Plan:  1) Low-risk pregnancy G3P2002 at [redacted]w[redacted]d with an Estimated Date of Delivery: 11/06/22   2) Mild anemia, rx qod Fe (Hgb 10.2)   Meds: No orders of the defined types were placed in this encounter.  Labs/procedures today: 2nd IT  Plan:  Continue routine obstetrical care   Reviewed: Preterm labor symptoms and general obstetric precautions including  but not limited to vaginal bleeding, contractions, leaking of fluid and fetal movement were reviewed in detail with the patient.  All questions were answered. Didn't ask about home bp cuff. Check bp weekly, let us know if >140/90.   Follow-up: Return for As scheduled.(Anatomy u/s)  Orders Placed This Encounter  Procedures   INTEGRATED 2   Arabella Merles [facility] 05/27/2022 2:07 PM

## 2022-05-27 NOTE — Patient Instructions (Signed)
[name], thank you for choosing our office today! We appreciate the opportunity to meet your healthcare needs. You may receive a short survey by mail, e-mail, or through MyChart. If you are happy with your care we would appreciate if you could take just a few minutes to complete the survey questions. We read all of your comments and take your feedback very seriously. Thank you again for choosing our office.  [facility] Team at [facility] ([address] [place], [place] [address]) Entrance C, located off of [address] Free 24/7 valet parking  Go to [email or web address] to register for FREE online childbirth classes  Call the office ([phone]) or go to [facility] if: You begin to severe cramping Your water breaks.  Sometimes it is a big gush of fluid, sometimes it is just a trickle that keeps getting your panties wet or running down your legs You have vaginal bleeding.  It is normal to have a small amount of spotting if your cervix was checked.   [place] Pediatricians/Family Doctors [facility] ([facility]): [address], [phone]           [facility]: [address], [phone]                [facility] ([facility]): [address], [phone] (call to ask if accepting patients) [facility]: [address], [place], [phone]    [place] Pediatricians/Family Doctors [facility] ([facility]): [address], [phone] [facility]: [address], [phone] [facility]: [address], [phone]  [place] [facility]  [facility] ([facility]): [phone] [facility]: [address], [phone]   [place] [facility] Doctors [facility]: [address], [phone]  [place] [facility]  [facility]: [address], [phone]  Home Blood Pressure Monitoring for Patients   Your provider has recommended that you check your blood pressure (BP) at least once a week at home. If you do not have a blood pressure cuff at home, one will be provided for you. Contact your provider if you have not received your monitor within 1 week.   Helpful Tips for Accurate Home Blood Pressure Checks  Don't smoke, exercise, or drink caffeine 30 minutes before checking your BP Use the restroom before checking your BP (a full bladder can raise your pressure) Relax in a comfortable upright chair Feet on the ground Left arm resting comfortably on a flat surface at the level of your heart Legs uncrossed Back supported Sit quietly and don't talk Place the cuff on your bare arm Adjust snuggly, so that only two fingertips can fit between your skin and the top of the cuff Check 2 readings separated by at least one minute Keep a log of your BP readings For a visual, please reference this diagram: http://[facility].care/bpdiagram  Provider Name: [facility]     Phone: [phone]  Zone 1: ALL CLEAR  Continue to monitor your symptoms:  BP reading is less than 140 (top number) or less than 90 (bottom number)  No right upper stomach pain No headaches or seeing spots No feeling nauseated or throwing up No swelling in face and hands  Zone 2: CAUTION Call your doctor's office for any of the following:  BP reading is greater than 140 (top number) or greater than   90 (bottom number)  Stomach pain under your ribs in the middle or right side Headaches or seeing spots Feeling nauseated or throwing up Swelling in face and hands  Zone 3: EMERGENCY  Seek immediate medical care if you have any of the following:  BP reading is greater than160 (top number) or greater than 110 (bottom number) Severe headaches not improving with Tylenol Serious difficulty catching your breath Any worsening symptoms from  Zone 2     Second Trimester of Pregnancy The second trimester is from week 14 through week 27 (months 4 through 6). The second trimester is often a time when you feel your best. Your body has adjusted to being pregnant, and you begin to feel better physically. Usually, morning sickness has lessened or quit completely, you may have more energy, and you may have an increase in appetite. The second trimester is also a time when the fetus is growing rapidly. At the end of the sixth month, the fetus is about 9 inches long and weighs about 1 pounds. You will likely begin to feel the baby move (quickening) between 16 and 20 weeks of pregnancy. Body changes during your second trimester Your body continues to go through many changes during your second trimester. The changes vary from woman to woman. Your weight will continue to increase. You will notice your lower abdomen bulging out. You may begin to get stretch marks on your hips, abdomen, and breasts. You may develop headaches that can be relieved by medicines. The medicines should be approved by your health care provider. You may urinate more often because the fetus is pressing on your bladder. You may develop or continue to have heartburn as a result of your pregnancy. You may develop constipation because certain hormones are causing the muscles that push waste through your intestines to slow down. You may develop hemorrhoids or swollen, bulging veins (varicose veins). You may have back pain. This is caused by: Weight gain. Pregnancy hormones that are relaxing the joints in your pelvis. A shift in weight and the muscles that support your balance. Your breasts will continue to grow and they will continue to become tender. Your gums may bleed and may be sensitive to brushing and flossing. Dark spots or blotches (chloasma, mask of pregnancy) may develop on your face. This will likely fade after the baby is born. A dark line from your belly button to  the pubic area (linea nigra) may appear. This will likely fade after the baby is born. You may have changes in your hair. These can include thickening of your hair, rapid growth, and changes in texture. Some women also have hair loss during or after pregnancy, or hair that feels dry or thin. Your hair will most likely return to normal after your baby is born.  What to expect at prenatal visits During a routine prenatal visit: You will be weighed to make sure you and the fetus are growing normally. Your blood pressure will be taken. Your abdomen will be measured to track your baby's growth. The fetal heartbeat will be listened to. Any test results from the previous visit will be discussed.  Your health care provider may ask you: How you are feeling. If you are feeling the baby move. If you have had any abnormal symptoms, such as leaking fluid, bleeding, severe headaches, or abdominal cramping. If you are using any tobacco products, including cigarettes, chewing tobacco, and electronic cigarettes. If you have any questions.  Other tests that may be performed during   your second trimester include: Blood tests that check for: Low iron levels (anemia). High blood sugar that affects pregnant women (gestational diabetes) between 24 and 28 weeks. Rh antibodies. This is to check for a protein on red blood cells (Rh factor). Urine tests to check for infections, diabetes, or protein in the urine. An ultrasound to confirm the proper growth and development of the baby. An amniocentesis to check for possible genetic problems. Fetal screens for spina bifida and Down syndrome. HIV (human immunodeficiency virus) testing. Routine prenatal testing includes screening for HIV, unless you choose not to have this test.  Follow these instructions at home: Medicines Follow your health care provider's instructions regarding medicine use. Specific medicines may be either safe or unsafe to take during  pregnancy. Take a prenatal vitamin that contains at least 600 micrograms (mcg) of folic acid. If you develop constipation, try taking a stool softener if your health care provider approves. Eating and drinking Eat a balanced diet that includes fresh fruits and vegetables, whole grains, good sources of protein such as meat, eggs, or tofu, and low-fat dairy. Your health care provider will help you determine the amount of weight gain that is right for you. Avoid raw meat and uncooked cheese. These carry germs that can cause birth defects in the baby. If you have low calcium intake from food, talk to your health care provider about whether you should take a daily calcium supplement. Limit foods that are high in fat and processed sugars, such as fried and sweet foods. To prevent constipation: Drink enough fluid to keep your urine clear or pale yellow. Eat foods that are high in fiber, such as fresh fruits and vegetables, whole grains, and beans. Activity Exercise only as directed by your health care provider. Most women can continue their usual exercise routine during pregnancy. Try to exercise for 30 minutes at least 5 days a week. Stop exercising if you experience uterine contractions. Avoid heavy lifting, wear low heel shoes, and practice good posture. A sexual relationship may be continued unless your health care provider directs you otherwise. Relieving pain and discomfort Wear a good support bra to prevent discomfort from breast tenderness. Take warm sitz baths to soothe any pain or discomfort caused by hemorrhoids. Use hemorrhoid cream if your health care provider approves. Rest with your legs elevated if you have leg cramps or low back pain. If you develop varicose veins, wear support hose. Elevate your feet for 15 minutes, 3-4 times a day. Limit salt in your diet. Prenatal Care Write down your questions. Take them to your prenatal visits. Keep all your prenatal visits as told by your health  care provider. This is important. Safety Wear your seat belt at all times when driving. Make a list of emergency phone numbers, including numbers for family, friends, the hospital, and police and fire departments. General instructions Ask your health care provider for a referral to a local prenatal education class. Begin classes no later than the beginning of month 6 of your pregnancy. Ask for help if you have counseling or nutritional needs during pregnancy. Your health care provider can offer advice or refer you to specialists for help with various needs. Do not use hot tubs, steam rooms, or saunas. Do not douche or use tampons or scented sanitary pads. Do not cross your legs for long periods of time. Avoid cat litter boxes and soil used by cats. These carry germs that can cause birth defects in the baby and possibly loss of the   fetus by miscarriage or stillbirth. Avoid all smoking, herbs, alcohol, and unprescribed drugs. Chemicals in these products can affect the formation and growth of the baby. Do not use any products that contain nicotine or tobacco, such as cigarettes and e-cigarettes. If you need help quitting, ask your health care provider. Visit your dentist if you have not gone yet during your pregnancy. Use a soft toothbrush to brush your teeth and be gentle when you floss. Contact a health care provider if: You have dizziness. You have mild pelvic cramps, pelvic pressure, or nagging pain in the abdominal area. You have persistent nausea, vomiting, or diarrhea. You have a bad smelling vaginal discharge. You have pain when you urinate. Get help right away if: You have a fever. You are leaking fluid from your vagina. You have spotting or bleeding from your vagina. You have severe abdominal cramping or pain. You have rapid weight gain or weight loss. You have shortness of breath with chest pain. You notice sudden or extreme swelling of your face, hands, ankles, feet, or legs. You  have not felt your baby move in over an hour. You have severe headaches that do not go away when you take medicine. You have vision changes. Summary The second trimester is from week 14 through week 27 (months 4 through 6). It is also a time when the fetus is growing rapidly. Your body goes through many changes during pregnancy. The changes vary from woman to woman. Avoid all smoking, herbs, alcohol, and unprescribed drugs. These chemicals affect the formation and growth your baby. Do not use any tobacco products, such as cigarettes, chewing tobacco, and e-cigarettes. If you need help quitting, ask your health care provider. Contact your health care provider if you have any questions. Keep all prenatal visits as told by your health care provider. This is important. This information is not intended to replace advice given to you by your health care provider. Make sure you discuss any questions you have with your health care provider. Document Released: 05/12/2001 Document Revised: 10/24/2015 Document Reviewed: 07/19/2012 Elsevier Interactive Patient Education  2017 [employer]  

## 2022-05-29 LAB — INTEGRATED 2
AFP MoM: 1.6
Alpha-Fetoprotein: 51.5 ng/mL
Crown Rump Length: 68.7 mm
DIA MoM: 2.17
DIA Value: 298.7 pg/mL
Estriol, Unconjugated: 1 ng/mL
Gest. Age on Collection Date: 12.9 weeks
Gestational Age: 16.9 weeks
Maternal Age at EDD: 20.8 yr
Nuchal Translucency (NT): 1.9 mm
Nuchal Translucency MoM: 1.22
Number of Fetuses: 1
PAPP-A MoM: 0.97
PAPP-A Value: 932.5 ng/mL
Test Results:: NEGATIVE
Weight: 175 [lb_av]
Weight: 176 [lb_av]
hCG MoM: 1.51
hCG Value: 43.2 IU/mL
uE3 MoM: 0.92

## 2022-06-17 ENCOUNTER — Ambulatory Visit (INDEPENDENT_AMBULATORY_CARE_PROVIDER_SITE_OTHER): Payer: Medicaid Other | Admitting: Advanced Practice Midwife

## 2022-06-17 ENCOUNTER — Ambulatory Visit (INDEPENDENT_AMBULATORY_CARE_PROVIDER_SITE_OTHER): Payer: Medicaid Other

## 2022-06-17 VITALS — BP 117/72 | HR 93 | Wt 179.0 lb

## 2022-06-17 DIAGNOSIS — Z363 Encounter for antenatal screening for malformations: Secondary | ICD-10-CM | POA: Diagnosis not present

## 2022-06-17 DIAGNOSIS — Z3480 Encounter for supervision of other normal pregnancy, unspecified trimester: Secondary | ICD-10-CM

## 2022-06-17 DIAGNOSIS — Z3A19 19 weeks gestation of pregnancy: Secondary | ICD-10-CM | POA: Diagnosis not present

## 2022-06-17 DIAGNOSIS — O99012 Anemia complicating pregnancy, second trimester: Secondary | ICD-10-CM

## 2022-06-17 NOTE — Progress Notes (Signed)
[name] 19+5 wks,breech,posterior placenta gr 0,SVP of fluid 4 cm,FHR 150 bpm,cx 4.2 cm,amnionic sludge,EFW 362 g 88%,anatomy complete

## 2022-06-17 NOTE — Progress Notes (Signed)
   LOW-RISK PREGNANCY VISIT Patient name: [name] MRN [id number]  Date of birth: 01-06-2001 Chief Complaint:   Routine Prenatal Visit  History of Present Illness:   [name] is a 22 y.o. G70P2002 female at [redacted]w[redacted]d with an Estimated Date of Delivery: 11/06/22 being seen today for ongoing management of a low-risk pregnancy.  Today she reports  bilat breast sensitivity, esp the upper portions; numbness under R breast; not wearing bra typically . Contractions: Not present. Vag. Bleeding: None.  Movement: Present. denies leaking of fluid. Review of Systems:   Pertinent items are noted in HPI Denies abnormal vaginal discharge w/ itching/odor/irritation, headaches, visual changes, shortness of breath, chest pain, abdominal pain, severe nausea/vomiting, or problems with urination or bowel movements unless otherwise stated above. Pertinent History Reviewed:  Reviewed past medical,surgical, social, obstetrical and family history.  Reviewed problem list, medications and allergies. Physical Assessment:   Vitals:   06/17/22 1523  BP: 117/72  Pulse: 93  Weight: 179 lb (81.2 kg)  Body mass index is 28.04 kg/m.        Physical Examination:   General appearance: Well appearing, and in no distress  Mental status: Alert, oriented to person, place, and time  Skin: Warm & dry  Cardiovascular: Normal heart rate noted  Respiratory: Normal respiratory effort, no distress  Abdomen: Soft, gravid, nontender  Pelvic: Cervical exam deferred         Extremities:    Fetal Status: Fetal Heart Rate (bpm): 150 u/s   Movement: Present    Anatomy u/s: [name] 19+5 wks,breech,posterior placenta gr 0,SVP of fluid 4 cm,FHR 150 bpm,cx 4.2 cm,amnionic sludge,EFW 362 g 88%,anatomy complete   No results found for this or any previous visit (from the past 24 hour(s)).  Assessment & Plan:  1) Low-risk pregnancy G3P2002 at [redacted]w[redacted]d with an Estimated Date of Delivery: 11/06/22   2) Bilat breast sensitivity/tenderness,  recommended wearing support, however she states it is too uncomfortable   Meds: No orders of the defined types were placed in this encounter.  Labs/procedures today: anatomy u/s  Plan:  Continue routine obstetrical care   Reviewed: Preterm labor symptoms and general obstetric precautions including but not limited to vaginal bleeding, contractions, leaking of fluid and fetal movement were reviewed in detail with the patient.  All questions were answered. Didn't ask about home bp cuff. Check bp weekly, let us know if >140/90.   Follow-up: Return in about 4 weeks (around 07/15/2022) for [place], in person.  No orders of the defined types were placed in this encounter.  Myrtis Ser CNM 06/17/2022 3:54 PM

## 2022-06-17 NOTE — Patient Instructions (Signed)
[name], thank you for choosing our office today! We appreciate the opportunity to meet your healthcare needs. You may receive a short survey by mail, e-mail, or through [employer]. If you are happy with your care we would appreciate if you could take just a few minutes to complete the survey questions. We read all of your comments and take your feedback very seriously. Thank you again for choosing our office.  Center for [employer] Team at [place] at [facility] ([place], [place] [address]) Entrance C, located off of [address] parking  Go to [email or web address] to register for FREE online childbirth classes  Call the office [phone]) or go to [facility] if: You begin to severe cramping Your water breaks.  Sometimes it is a big gush of fluid, sometimes it is just a trickle that keeps getting your panties wet or running down your legs You have vaginal bleeding.  It is normal to have a small amount of spotting if your cervix was checked.   [facility] Pediatricians/Family Doctors [facility]): [address] [place], [place] [facility]: [address] [place], [phone]                [facility]): [place], [phone] (call to ask if accepting patients) [facility] Department: [address], [place], [place] Pediatricians/Family Doctors [facility]): [place] [place], [address], [place] [facility]: [address] [place], [place] [facility]: [place], [facility]): [phone] [facility]: [address] [place]: [address], [place] [facility]: [phone], [phone]  Home Blood Pressure Monitoring for Patients   Your provider has recommended that you check your blood pressure (BP) at least once a week at home. If you do not have a blood pressure cuff at home, one will be provided for you. Contact your provider if you have not received your monitor within 1 week.   Helpful Tips for Accurate Home Blood Pressure Checks  Don't smoke, exercise, or drink caffeine 30 minutes before checking your BP Use the restroom before checking your BP (a full bladder can raise your pressure) Relax in a comfortable upright chair Feet on the ground Left arm resting comfortably on a flat surface at the level of your heart Legs uncrossed Back supported Sit quietly and don't talk Place the cuff on your bare arm Adjust snuggly, so that only two fingertips can fit between your skin and the top of the cuff Check 2 readings separated by at least one minute Keep a log of your BP readings For a visual, please reference this diagram: http://ccnc.care/bpdiagram  Provider Name: [facility]     Phone: [phone]  Zone 1: ALL CLEAR  Continue to monitor your symptoms:  BP reading is less than 140 (top number) or less than 90 (bottom number)  No right upper stomach pain No headaches or seeing spots No feeling nauseated or throwing up No swelling in face and hands  Zone 2: CAUTION Call your doctor's office for any of the following:  BP reading is greater than 140 (top number) or greater than  90 (bottom number)  Stomach pain under your ribs in the middle or right side Headaches or seeing spots Feeling nauseated or throwing up Swelling in face and hands  Zone 3: EMERGENCY  Seek immediate medical care if you have any of the following:  BP reading is greater than160 (top number) or greater than 110 (bottom number) Severe headaches not improving with Tylenol Serious difficulty catching your breath Any worsening symptoms from  Zone 2     Second Trimester of Pregnancy The second trimester is from week 14 through week 27 (months 4 through 6). The second trimester is often a time when you feel your best. Your body has adjusted to being pregnant, and you begin to feel better physically. Usually, morning sickness has lessened or quit completely, you may have more energy, and you may have an increase in appetite. The second trimester is also a time when the fetus is growing rapidly. At the end of the sixth month, the fetus is about 9 inches long and weighs about 1 pounds. You will likely begin to feel the baby move (quickening) between 16 and 20 weeks of pregnancy. Body changes during your second trimester Your body continues to go through many changes during your second trimester. The changes vary from woman to woman. Your weight will continue to increase. You will notice your lower abdomen bulging out. You may begin to get stretch marks on your hips, abdomen, and breasts. You may develop headaches that can be relieved by medicines. The medicines should be approved by your health care provider. You may urinate more often because the fetus is pressing on your bladder. You may develop or continue to have heartburn as a result of your pregnancy. You may develop constipation because certain hormones are causing the muscles that push waste through your intestines to slow down. You may develop hemorrhoids or swollen, bulging veins (varicose veins). You may have back pain. This is caused by: Weight gain. Pregnancy hormones that are relaxing the joints in your pelvis. A shift in weight and the muscles that support your balance. Your breasts will continue to grow and they will continue to become tender. Your gums may bleed and may be sensitive to brushing and flossing. Dark spots or blotches (chloasma, mask of pregnancy) may develop on your face. This will likely fade after the baby is born. A dark line from your belly button to  the pubic area (linea nigra) may appear. This will likely fade after the baby is born. You may have changes in your hair. These can include thickening of your hair, rapid growth, and changes in texture. Some women also have hair loss during or after pregnancy, or hair that feels dry or thin. Your hair will most likely return to normal after your baby is born.  What to expect at prenatal visits During a routine prenatal visit: You will be weighed to make sure you and the fetus are growing normally. Your blood pressure will be taken. Your abdomen will be measured to track your baby's growth. The fetal heartbeat will be listened to. Any test results from the previous visit will be discussed.  Your health care provider may ask you: How you are feeling. If you are feeling the baby move. If you have had any abnormal symptoms, such as leaking fluid, bleeding, severe headaches, or abdominal cramping. If you are using any tobacco products, including cigarettes, chewing tobacco, and electronic cigarettes. If you have any questions.  Other tests that may be performed during  your second trimester include: Blood tests that check for: Low iron levels (anemia). High blood sugar that affects pregnant women (gestational diabetes) between 41 and 28 weeks. Rh antibodies. This is to check for a protein on red blood cells (Rh factor). Urine tests to check for infections, diabetes, or protein in the urine. An ultrasound to confirm the proper growth and development of the baby. An amniocentesis to check for possible genetic problems. Fetal screens for spina bifida and Down syndrome. HIV (human immunodeficiency virus) testing. Routine prenatal testing includes screening for HIV, unless you choose not to have this test.  Follow these instructions at home: Medicines Follow your health care provider's instructions regarding medicine use. Specific medicines may be either safe or unsafe to take during  pregnancy. Take a prenatal vitamin that contains at least 600 micrograms (mcg) of folic acid. If you develop constipation, try taking a stool softener if your health care provider approves. Eating and drinking Eat a balanced diet that includes fresh fruits and vegetables, whole grains, good sources of protein such as meat, eggs, or tofu, and low-fat dairy. Your health care provider will help you determine the amount of weight gain that is right for you. Avoid raw meat and uncooked cheese. These carry germs that can cause birth defects in the baby. If you have low calcium intake from food, talk to your health care provider about whether you should take a daily calcium supplement. Limit foods that are high in fat and processed sugars, such as fried and sweet foods. To prevent constipation: Drink enough fluid to keep your urine clear or pale yellow. Eat foods that are high in fiber, such as fresh fruits and vegetables, whole grains, and beans. Activity Exercise only as directed by your health care provider. Most women can continue their usual exercise routine during pregnancy. Try to exercise for 30 minutes at least 5 days a week. Stop exercising if you experience uterine contractions. Avoid heavy lifting, wear low heel shoes, and practice good posture. A sexual relationship may be continued unless your health care provider directs you otherwise. Relieving pain and discomfort Wear a good support bra to prevent discomfort from breast tenderness. Take warm sitz baths to soothe any pain or discomfort caused by hemorrhoids. Use hemorrhoid cream if your health care provider approves. Rest with your legs elevated if you have leg cramps or low back pain. If you develop varicose veins, wear support hose. Elevate your feet for 15 minutes, 3-4 times a day. Limit salt in your diet. Prenatal Care Write down your questions. Take them to your prenatal visits. Keep all your prenatal visits as told by your health  care provider. This is important. Safety Wear your seat belt at all times when driving. Make a list of emergency phone numbers, including numbers for family, friends, the hospital, and police and fire departments. General instructions Ask your health care provider for a referral to a local prenatal education class. Begin classes no later than the beginning of month 6 of your pregnancy. Ask for help if you have counseling or nutritional needs during pregnancy. Your health care provider can offer advice or refer you to specialists for help with various needs. Do not use hot tubs, steam rooms, or saunas. Do not douche or use tampons or scented sanitary pads. Do not cross your legs for long periods of time. Avoid cat litter boxes and soil used by cats. These carry germs that can cause birth defects in the baby and possibly loss of the  fetus by miscarriage or stillbirth. Avoid all smoking, herbs, alcohol, and unprescribed drugs. Chemicals in these products can affect the formation and growth of the baby. Do not use any products that contain nicotine or tobacco, such as cigarettes and e-cigarettes. If you need help quitting, ask your health care provider. Visit your dentist if you have not gone yet during your pregnancy. Use a soft toothbrush to brush your teeth and be gentle when you floss. Contact a health care provider if: You have dizziness. You have mild pelvic cramps, pelvic pressure, or nagging pain in the abdominal area. You have persistent nausea, vomiting, or diarrhea. You have a bad smelling vaginal discharge. You have pain when you urinate. Get help right away if: You have a fever. You are leaking fluid from your vagina. You have spotting or bleeding from your vagina. You have severe abdominal cramping or pain. You have rapid weight gain or weight loss. You have shortness of breath with chest pain. You notice sudden or extreme swelling of your face, hands, ankles, feet, or legs. You  have not felt your baby move in over an hour. You have severe headaches that do not go away when you take medicine. You have vision changes. Summary The second trimester is from week 14 through week 27 (months 4 through 6). It is also a time when the fetus is growing rapidly. Your body goes through many changes during pregnancy. The changes vary from woman to woman. Avoid all smoking, herbs, alcohol, and unprescribed drugs. These chemicals affect the formation and growth your baby. Do not use any tobacco products, such as cigarettes, chewing tobacco, and e-cigarettes. If you need help quitting, ask your health care provider. Contact your health care provider if you have any questions. Keep all prenatal visits as told by your health care provider. This is important. This information is not intended to replace advice given to you by your health care provider. Make sure you discuss any questions you have with your health care provider. Document Released: 05/12/2001 Document Revised: 10/24/2015 Document Reviewed: 07/19/2012 Elsevier Interactive Patient Education  2017 [employer].

## 2022-07-15 ENCOUNTER — Encounter: Payer: Self-pay | Admitting: Advanced Practice Midwife

## 2022-07-15 DIAGNOSIS — R110 Nausea: Secondary | ICD-10-CM | POA: Diagnosis not present

## 2022-07-15 DIAGNOSIS — R079 Chest pain, unspecified: Secondary | ICD-10-CM | POA: Diagnosis not present

## 2022-07-15 DIAGNOSIS — R112 Nausea with vomiting, unspecified: Secondary | ICD-10-CM | POA: Diagnosis not present

## 2022-07-15 DIAGNOSIS — Z3A23 23 weeks gestation of pregnancy: Secondary | ICD-10-CM | POA: Diagnosis not present

## 2022-07-15 DIAGNOSIS — R000 Tachycardia, unspecified: Secondary | ICD-10-CM | POA: Diagnosis not present

## 2022-07-15 DIAGNOSIS — R1111 Vomiting without nausea: Secondary | ICD-10-CM | POA: Diagnosis not present

## 2022-07-15 DIAGNOSIS — O219 Vomiting of pregnancy, unspecified: Secondary | ICD-10-CM | POA: Diagnosis not present

## 2022-08-13 ENCOUNTER — Telehealth: Payer: Self-pay | Admitting: Advanced Practice Midwife

## 2022-08-13 NOTE — Telephone Encounter (Signed)
Patient wants to know if she can take extra strength tylenol. She thinks she slept wrong and she's having back pain. Please advise.

## 2022-08-13 NOTE — Telephone Encounter (Signed)
Returned patient's call.  Unable to leave voicemail. 

## 2022-08-14 NOTE — Telephone Encounter (Signed)
Returned patient's call. VM not set up.

## 2022-08-17 ENCOUNTER — Encounter: Payer: Self-pay | Admitting: Advanced Practice Midwife

## 2022-08-17 ENCOUNTER — Other Ambulatory Visit: Payer: Self-pay

## 2022-08-17 DIAGNOSIS — Z3480 Encounter for supervision of other normal pregnancy, unspecified trimester: Secondary | ICD-10-CM

## 2022-08-17 DIAGNOSIS — Z131 Encounter for screening for diabetes mellitus: Secondary | ICD-10-CM

## 2022-08-17 DIAGNOSIS — Z3A27 27 weeks gestation of pregnancy: Secondary | ICD-10-CM

## 2022-08-22 DIAGNOSIS — M549 Dorsalgia, unspecified: Secondary | ICD-10-CM | POA: Diagnosis not present

## 2022-08-22 DIAGNOSIS — O219 Vomiting of pregnancy, unspecified: Secondary | ICD-10-CM | POA: Diagnosis not present

## 2022-08-22 DIAGNOSIS — Z3A29 29 weeks gestation of pregnancy: Secondary | ICD-10-CM | POA: Diagnosis not present

## 2022-08-22 DIAGNOSIS — O99891 Other specified diseases and conditions complicating pregnancy: Secondary | ICD-10-CM | POA: Diagnosis not present

## 2022-08-22 DIAGNOSIS — O26893 Other specified pregnancy related conditions, third trimester: Secondary | ICD-10-CM | POA: Diagnosis not present

## 2022-08-22 DIAGNOSIS — R109 Unspecified abdominal pain: Secondary | ICD-10-CM | POA: Diagnosis not present

## 2022-08-22 DIAGNOSIS — R1111 Vomiting without nausea: Secondary | ICD-10-CM | POA: Diagnosis not present

## 2022-08-22 DIAGNOSIS — R079 Chest pain, unspecified: Secondary | ICD-10-CM | POA: Diagnosis not present

## 2022-08-23 DIAGNOSIS — Z3A29 29 weeks gestation of pregnancy: Secondary | ICD-10-CM | POA: Diagnosis not present

## 2022-08-23 DIAGNOSIS — O09899 Supervision of other high risk pregnancies, unspecified trimester: Secondary | ICD-10-CM | POA: Diagnosis not present

## 2022-08-23 DIAGNOSIS — R8789 Other abnormal findings in specimens from female genital organs: Secondary | ICD-10-CM | POA: Diagnosis not present

## 2022-09-20 DIAGNOSIS — Z3A33 33 weeks gestation of pregnancy: Secondary | ICD-10-CM | POA: Diagnosis not present

## 2022-09-20 DIAGNOSIS — O219 Vomiting of pregnancy, unspecified: Secondary | ICD-10-CM | POA: Diagnosis not present

## 2022-09-20 DIAGNOSIS — O2303 Infections of kidney in pregnancy, third trimester: Secondary | ICD-10-CM | POA: Diagnosis not present

## 2022-09-21 DIAGNOSIS — O2303 Infections of kidney in pregnancy, third trimester: Secondary | ICD-10-CM | POA: Diagnosis not present

## 2022-09-21 DIAGNOSIS — Z3A33 33 weeks gestation of pregnancy: Secondary | ICD-10-CM | POA: Diagnosis not present

## 2022-10-21 DIAGNOSIS — D72829 Elevated white blood cell count, unspecified: Secondary | ICD-10-CM | POA: Diagnosis not present

## 2022-10-21 DIAGNOSIS — Z3A37 37 weeks gestation of pregnancy: Secondary | ICD-10-CM | POA: Diagnosis not present

## 2022-10-21 DIAGNOSIS — O9913 Other diseases of the blood and blood-forming organs and certain disorders involving the immune mechanism complicating the puerperium: Secondary | ICD-10-CM | POA: Diagnosis not present

## 2022-10-21 DIAGNOSIS — Z79899 Other long term (current) drug therapy: Secondary | ICD-10-CM | POA: Diagnosis not present

## 2022-10-21 DIAGNOSIS — O864 Pyrexia of unknown origin following delivery: Secondary | ICD-10-CM | POA: Diagnosis not present

## 2022-10-21 DIAGNOSIS — R509 Fever, unspecified: Secondary | ICD-10-CM | POA: Diagnosis not present

## 2022-10-21 DIAGNOSIS — O9081 Anemia of the puerperium: Secondary | ICD-10-CM | POA: Diagnosis not present

## 2022-12-30 DIAGNOSIS — R109 Unspecified abdominal pain: Secondary | ICD-10-CM | POA: Diagnosis not present

## 2022-12-30 DIAGNOSIS — K5900 Constipation, unspecified: Secondary | ICD-10-CM | POA: Diagnosis not present

## 2022-12-30 DIAGNOSIS — K6289 Other specified diseases of anus and rectum: Secondary | ICD-10-CM | POA: Diagnosis not present

## 2022-12-30 DIAGNOSIS — Z5329 Procedure and treatment not carried out because of patient's decision for other reasons: Secondary | ICD-10-CM | POA: Diagnosis not present

## 2022-12-30 DIAGNOSIS — R1084 Generalized abdominal pain: Secondary | ICD-10-CM | POA: Diagnosis not present

## 2023-01-14 DIAGNOSIS — R509 Fever, unspecified: Secondary | ICD-10-CM | POA: Diagnosis not present

## 2023-01-14 DIAGNOSIS — M549 Dorsalgia, unspecified: Secondary | ICD-10-CM | POA: Diagnosis not present

## 2023-01-14 DIAGNOSIS — R110 Nausea: Secondary | ICD-10-CM | POA: Diagnosis not present

## 2023-01-17 DIAGNOSIS — M545 Low back pain, unspecified: Secondary | ICD-10-CM | POA: Diagnosis not present

## 2023-01-17 DIAGNOSIS — R109 Unspecified abdominal pain: Secondary | ICD-10-CM | POA: Diagnosis not present

## 2023-01-17 DIAGNOSIS — R231 Pallor: Secondary | ICD-10-CM | POA: Diagnosis not present

## 2023-01-17 DIAGNOSIS — R000 Tachycardia, unspecified: Secondary | ICD-10-CM | POA: Diagnosis not present

## 2023-01-17 DIAGNOSIS — R0689 Other abnormalities of breathing: Secondary | ICD-10-CM | POA: Diagnosis not present

## 2023-01-17 DIAGNOSIS — I499 Cardiac arrhythmia, unspecified: Secondary | ICD-10-CM | POA: Diagnosis not present

## 2023-01-17 DIAGNOSIS — N390 Urinary tract infection, site not specified: Secondary | ICD-10-CM | POA: Diagnosis not present

## 2023-01-17 DIAGNOSIS — M549 Dorsalgia, unspecified: Secondary | ICD-10-CM | POA: Diagnosis not present

## 2023-01-17 DIAGNOSIS — R112 Nausea with vomiting, unspecified: Secondary | ICD-10-CM | POA: Diagnosis not present

## 2023-01-17 DIAGNOSIS — R1011 Right upper quadrant pain: Secondary | ICD-10-CM | POA: Diagnosis not present

## 2023-04-01 DIAGNOSIS — R1084 Generalized abdominal pain: Secondary | ICD-10-CM | POA: Diagnosis not present

## 2023-04-01 DIAGNOSIS — R109 Unspecified abdominal pain: Secondary | ICD-10-CM | POA: Diagnosis not present

## 2023-04-01 DIAGNOSIS — R1032 Left lower quadrant pain: Secondary | ICD-10-CM | POA: Diagnosis not present

## 2023-04-01 DIAGNOSIS — F17210 Nicotine dependence, cigarettes, uncomplicated: Secondary | ICD-10-CM | POA: Diagnosis not present

## 2023-04-01 DIAGNOSIS — K8020 Calculus of gallbladder without cholecystitis without obstruction: Secondary | ICD-10-CM | POA: Diagnosis not present

## 2023-04-01 DIAGNOSIS — R000 Tachycardia, unspecified: Secondary | ICD-10-CM | POA: Diagnosis not present

## 2023-04-01 DIAGNOSIS — K5900 Constipation, unspecified: Secondary | ICD-10-CM | POA: Diagnosis not present

## 2023-04-01 DIAGNOSIS — F17290 Nicotine dependence, other tobacco product, uncomplicated: Secondary | ICD-10-CM | POA: Diagnosis not present

## 2023-04-01 DIAGNOSIS — N3000 Acute cystitis without hematuria: Secondary | ICD-10-CM | POA: Diagnosis not present

## 2023-04-01 DIAGNOSIS — R112 Nausea with vomiting, unspecified: Secondary | ICD-10-CM | POA: Diagnosis not present

## 2023-04-01 DIAGNOSIS — N390 Urinary tract infection, site not specified: Secondary | ICD-10-CM | POA: Diagnosis not present

## 2023-04-02 DIAGNOSIS — R112 Nausea with vomiting, unspecified: Secondary | ICD-10-CM | POA: Diagnosis not present

## 2023-04-02 DIAGNOSIS — R110 Nausea: Secondary | ICD-10-CM | POA: Diagnosis not present

## 2023-04-02 DIAGNOSIS — K5900 Constipation, unspecified: Secondary | ICD-10-CM | POA: Diagnosis not present

## 2023-04-02 DIAGNOSIS — R109 Unspecified abdominal pain: Secondary | ICD-10-CM | POA: Diagnosis not present

## 2023-04-02 DIAGNOSIS — R1111 Vomiting without nausea: Secondary | ICD-10-CM | POA: Diagnosis not present

## 2023-04-02 DIAGNOSIS — R531 Weakness: Secondary | ICD-10-CM | POA: Diagnosis not present

## 2023-04-02 DIAGNOSIS — K8020 Calculus of gallbladder without cholecystitis without obstruction: Secondary | ICD-10-CM | POA: Diagnosis not present

## 2023-04-02 DIAGNOSIS — N390 Urinary tract infection, site not specified: Secondary | ICD-10-CM | POA: Diagnosis not present

## 2023-04-02 DIAGNOSIS — R350 Frequency of micturition: Secondary | ICD-10-CM | POA: Diagnosis not present

## 2023-12-11 ENCOUNTER — Other Ambulatory Visit: Payer: Self-pay

## 2023-12-11 ENCOUNTER — Emergency Department (HOSPITAL_COMMUNITY)
Admission: EM | Admit: 2023-12-11 | Discharge: 2023-12-11 | Disposition: A | Discharge status: Home / Self Care | Attending: Emergency Medicine | Admitting: Emergency Medicine

## 2023-12-11 ENCOUNTER — Encounter (HOSPITAL_COMMUNITY): Payer: Self-pay

## 2023-12-11 ENCOUNTER — Emergency Department (HOSPITAL_COMMUNITY)

## 2023-12-11 DIAGNOSIS — M62838 Other muscle spasm: Secondary | ICD-10-CM

## 2023-12-11 DIAGNOSIS — R000 Tachycardia, unspecified: Secondary | ICD-10-CM | POA: Diagnosis not present

## 2023-12-11 DIAGNOSIS — R609 Edema, unspecified: Secondary | ICD-10-CM | POA: Diagnosis not present

## 2023-12-11 DIAGNOSIS — E876 Hypokalemia: Secondary | ICD-10-CM | POA: Insufficient documentation

## 2023-12-11 DIAGNOSIS — M7989 Other specified soft tissue disorders: Secondary | ICD-10-CM | POA: Diagnosis not present

## 2023-12-11 DIAGNOSIS — M79672 Pain in left foot: Secondary | ICD-10-CM | POA: Diagnosis not present

## 2023-12-11 DIAGNOSIS — T50904A Poisoning by unspecified drugs, medicaments and biological substances, undetermined, initial encounter: Secondary | ICD-10-CM | POA: Diagnosis not present

## 2023-12-11 LAB — BASIC METABOLIC PANEL WITH GFR
Anion gap: 10 (ref 5–15)
BUN: 14 mg/dL (ref 6–20)
CO2: 21 mmol/L — ABNORMAL LOW (ref 22–32)
Calcium: 8.9 mg/dL (ref 8.9–10.3)
Chloride: 111 mmol/L (ref 98–111)
Creatinine, Ser: 0.54 mg/dL (ref 0.44–1.00)
GFR, Estimated: >60 mL/min (ref >60)
Glucose, Bld: 89 mg/dL (ref 70–99)
Potassium: 2.9 mmol/L — ABNORMAL LOW (ref 3.5–5.1)
Sodium: 142 mmol/L (ref 135–145)

## 2023-12-11 LAB — POC URINE PREG, ED: Preg Test, Ur: NEGATIVE

## 2023-12-11 MED ORDER — POTASSIUM CHLORIDE CRYS ER 20 MEQ PO TBCR
40.0000 meq | EXTENDED_RELEASE_TABLET | Freq: Once | ORAL | Status: AC
  Administered 2023-12-11: 40 meq via ORAL
  Filled 2023-12-11: qty 2

## 2023-12-11 MED ORDER — METHOCARBAMOL 500 MG PO TABS: 500.0000 mg | ORAL_TABLET | Freq: Two times a day (BID) | ORAL | 0 refills | Status: AC

## 2023-12-11 MED ORDER — METHOCARBAMOL 500 MG PO TABS
500.0000 mg | ORAL_TABLET | Freq: Once | ORAL | Status: AC
Start: 2023-12-11 — End: 2023-12-11
  Administered 2023-12-11: 500 mg via ORAL
  Filled 2023-12-11: qty 1

## 2023-12-11 MED ORDER — POTASSIUM CHLORIDE CRYS ER 20 MEQ PO TBCR: 20.0000 meq | EXTENDED_RELEASE_TABLET | Freq: Two times a day (BID) | ORAL | 0 refills | Status: AC

## 2023-12-11 NOTE — ED Provider Notes (Signed)
 [facility] AT [facility] Provider Note   CSN: [id number] Arrival date & time: 12/11/23  1606     Patient presents with: Foot Pain   [name] is a 23 y.o. 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  She describes a severe muscle spasm like sensation within the foot and ankle, sometimes travels to the right upper anterior tibia area.  She does endorse probable dehydration, stating she has not been hydrating well.  She   The history is provided by the patient.       Prior to Admission medications   Medication Sig Start Date End Date Taking? Authorizing Provider  methocarbamol  (ROBAXIN ) 500 MG tablet Take 1 tablet (500 mg total) by mouth 2 (two) times daily. 12/11/23  Yes [name], Mliss, PA-C  potassium chloride  SA (KLOR-CON  M) 20 MEQ tablet Take 1 tablet (20 mEq total) by mouth 2 (two) times daily. 12/11/23  Yes IdolMliss, PA-C  Blood Pressure Monitor MISC For regular home bp monitoring during pregnancy Patient not taking: Reported on 03/11/2022 08/23/19   [name], CNM  Blood Pressure Monitor MISC For regular home bp monitoring during pregnancy Patient not taking: Reported on 05/27/2022 04/29/22   [name], CNM  ferrous sulfate  324 (65 Fe) MG TBEC Take 1 tablet (325 mg total) by mouth every other day. Patient not taking: Reported on 06/17/2022 05/03/22   [name], CNM  Prenatal MV & Min w/FA-DHA (PRENATAL ADULT GUMMY/DHA/FA PO) Take by mouth. Patient not taking: Reported on 06/17/2022    [provider]    Allergies: Patient has no known allergies.    Review of Systems  Constitutional:  Negative for fever.  Musculoskeletal:  Positive for arthralgias. Negative for joint swelling and myalgias.  Neurological:  Negative  for weakness and numbness.    Updated Vital Signs BP 116/83 (BP Location: Right Arm)   Pulse 83   Temp 97.7 F (36.5 C) (Oral)   Resp (!) 24   Ht 5' 7 (1.702 m)   Wt 62.1 kg   SpO2 99%   BMI 21.46 kg/m   Physical Exam Vitals and nursing note reviewed.  Constitutional:      Appearance: She is well-developed.  HENT:     Head: Normocephalic.  Cardiovascular:     Rate and Rhythm: Normal rate.     Pulses: Normal pulses. No decreased pulses.          Dorsalis pedis pulses are 2+ on the right side and 2+ on the left side.       Posterior tibial pulses are 2+ on the right side and 2+ on the left side.  Musculoskeletal:        General: Tenderness present. No swelling or deformity.     Right lower leg: No edema.     Left lower leg: No edema.     Right ankle: Swelling and ecchymosis present. Tenderness present over the lateral malleolus. No proximal fibula tenderness. Decreased range of motion. Normal pulse.     Right Achilles Tendon: Normal.     Comments: Patient has tenderness palpation at her left dorsal foot.  It does not localize to any joint.  She does have some mild dorsal edema.  Dorsalis pedal pulses intact.  This foot is held  in medial rotation, muscle spasm is appreciated.  She has no Achilles or calf tenderness palpation, no edema or palpable cords in the lower extremity.  Skin:    General: Skin is warm and dry.     Findings: No lesion.  Neurological:     Mental Status: She is alert.     Sensory: No sensory deficit.  Psychiatric:     Comments: Appears anxious.     (all labs ordered are listed, but only abnormal results are displayed) Labs Reviewed  BASIC METABOLIC PANEL WITH GFR - Abnormal; Notable for the following components:      Result Value   Potassium 2.9 (*)    CO2 21 (*)    All other components within normal limits  POC URINE PREG, ED - Normal    EKG: None  Radiology: DG Foot Complete Left Result Date: 12/11/2023 CLINICAL DATA:  Swelling, pain.  EXAM: LEFT FOOT - COMPLETE 3+ VIEW COMPARISON:  None Available. FINDINGS: There is no evidence of fracture or dislocation. No erosions or periostitis. Growth arrest line in the first metatarsal, benign. There is no evidence of arthropathy or other focal bone abnormality. Mild soft tissue edema. No soft tissue gas or radiopaque foreign body. IMPRESSION: Mild soft tissue edema. No acute osseous abnormality. Electronically Signed   By: [name] M.D.   On: 12/11/2023 18:21     Procedures   Medications Ordered in the ED  methocarbamol  (ROBAXIN ) tablet 500 mg (500 mg Oral Given 12/11/23 1928)  potassium chloride  SA (KLOR-CON  M) CR tablet 40 mEq (40 mEq Oral Given 12/11/23 1929)    Clinical Course as of 12/11/23 1947  Sat Dec 11, 2023  1752 This patient is a 23 year old female who states that she was sitting crosslegged for about 45 minutes when she looked down and noticed that her left foot which was crossed over her right knee was swollen, she was having pain in it, on my exam she has normal pulses, decreased range of motion secondary to pain, no focal tenderness of the foot, no obvious swelling, no discoloration and normal capillary refill.  Will obtain x-ray but the patient has what appears to be some musculoskeletal pain or possibly a mild peripheral nerve issue, there is no numbness no weakness no foot drop. [BM]    Clinical Course User Index [BM] Kendra Rogue, MD                                 Medical Decision Making Amount and/or Complexity of Data Reviewed Labs: ordered. Radiology: ordered.  Risk Prescription drug management.        Final diagnoses:  Muscle spasm  Hypokalemia    ED Discharge Orders          Ordered    potassium chloride  SA (KLOR-CON  M) 20 MEQ tablet  2 times daily        12/11/23 1941    methocarbamol  (ROBAXIN ) 500 MG tablet  2 times daily        12/11/23 1941    Apply wrap        12/11/23 1944               [name] 12/11/23 1947    Kendra Rogue, MD 12/13/23 2200

## 2023-12-11 NOTE — ED Triage Notes (Signed)
 Reports left foot and ankle started swelling today and became painful.  Patient hyperventilating in triage. And appears to be under the influence.  Admits to meth use 1 week ago.

## 2023-12-11 NOTE — Discharge Instructions (Signed)
 Your x-rays are negative, your blood test suggest you have a low potassium level which can cause muscle spasm.  I have prescribed you potassium tablets to take for the next week to replace this electrolyte.  I also prescribed you a muscle relaxer to take twice daily.  You may also benefit from elevation and a gentle heating pad applied to your foot.  You may wear the Ace wrap also which may also give additional comfort.  Plan to follow-up with your primary doctor if your symptoms are not improving with this treatment plan.
